# Patient Record
Sex: Male | Born: 1965 | Race: Black or African American | Hispanic: No | Marital: Married | Smoking: Never smoker
Health system: Southern US, Community
[De-identification: ages and names within clinical notes are randomized; demographics above are authoritative.]

## PROBLEM LIST (undated history)

## (undated) DIAGNOSIS — E119 Type 2 diabetes mellitus without complications: Secondary | ICD-10-CM

## (undated) DIAGNOSIS — I10 Essential (primary) hypertension: Secondary | ICD-10-CM

## (undated) DIAGNOSIS — E785 Hyperlipidemia, unspecified: Secondary | ICD-10-CM

## (undated) HISTORY — PX: NO PAST SURGERIES: SHX2092

## (undated) HISTORY — DX: Hyperlipidemia, unspecified: E78.5

---

## 2013-04-22 ENCOUNTER — Encounter: Payer: Self-pay | Admitting: Medical

## 2013-04-29 ENCOUNTER — Encounter: Payer: Self-pay | Admitting: Medical

## 2013-09-13 ENCOUNTER — Encounter (HOSPITAL_COMMUNITY): Payer: Self-pay | Admitting: Emergency Medicine

## 2013-09-13 ENCOUNTER — Emergency Department (HOSPITAL_COMMUNITY)
Admission: EM | Admit: 2013-09-13 | Discharge: 2013-09-13 | Disposition: A | Discharge status: Home / Self Care | Payer: BC Managed Care – PPO | Attending: Emergency Medicine | Admitting: Emergency Medicine

## 2013-09-13 DIAGNOSIS — J309 Allergic rhinitis, unspecified: Secondary | ICD-10-CM | POA: Insufficient documentation

## 2013-09-13 DIAGNOSIS — M549 Dorsalgia, unspecified: Secondary | ICD-10-CM | POA: Insufficient documentation

## 2013-09-13 DIAGNOSIS — R519 Headache, unspecified: Secondary | ICD-10-CM

## 2013-09-13 DIAGNOSIS — R1110 Vomiting, unspecified: Secondary | ICD-10-CM | POA: Insufficient documentation

## 2013-09-13 DIAGNOSIS — H938X9 Other specified disorders of ear, unspecified ear: Secondary | ICD-10-CM | POA: Insufficient documentation

## 2013-09-13 DIAGNOSIS — I10 Essential (primary) hypertension: Secondary | ICD-10-CM | POA: Insufficient documentation

## 2013-09-13 DIAGNOSIS — R0981 Nasal congestion: Secondary | ICD-10-CM

## 2013-09-13 DIAGNOSIS — E119 Type 2 diabetes mellitus without complications: Secondary | ICD-10-CM | POA: Insufficient documentation

## 2013-09-13 DIAGNOSIS — Z9109 Other allergy status, other than to drugs and biological substances: Secondary | ICD-10-CM

## 2013-09-13 DIAGNOSIS — G8929 Other chronic pain: Secondary | ICD-10-CM | POA: Insufficient documentation

## 2013-09-13 DIAGNOSIS — M25569 Pain in unspecified knee: Secondary | ICD-10-CM | POA: Insufficient documentation

## 2013-09-13 DIAGNOSIS — R51 Headache: Secondary | ICD-10-CM | POA: Insufficient documentation

## 2013-09-13 HISTORY — DX: Essential (primary) hypertension: I10

## 2013-09-13 HISTORY — DX: Type 2 diabetes mellitus without complications: E11.9

## 2013-09-13 LAB — CBG MONITORING, ED: Glucose-Capillary: 170 mg/dL — ABNORMAL HIGH (ref 70–99)

## 2013-09-13 MED ORDER — AZITHROMYCIN 250 MG PO TABS: ORAL_TABLET | ORAL | Status: DC

## 2013-09-13 MED ORDER — METFORMIN HCL 500 MG PO TABS: 500.0000 mg | ORAL_TABLET | Freq: Two times a day (BID) | ORAL | Status: DC

## 2013-09-13 NOTE — Discharge Instructions (Signed)
You are having a headache. No specific cause was found today for your headache. It may have been a migraine or other cause of headache. Stress, anxiety, fatigue, and depression are common triggers for headaches. Your headache today does not appear to be life-threatening or require hospitalization, but often the exact cause of headaches is not determined in the emergency department. Therefore, follow-up with your doctor is very important to find out what may have caused your headache, and whether or not you need any further diagnostic testing or treatment. Sometimes headaches can appear benign (not harmful), but then more serious symptoms can develop which should prompt an immediate re-evaluation by your doctor or the emergency department. SEEK MEDICAL ATTENTION IF: You develop possible problems with medications prescribed.  The medications don't resolve your headache, if it recurs , or if you have multiple episodes of vomiting or can't take fluids. You have a change from the usual headache. RETURN IMMEDIATELY IF you develop a sudden, severe headache or confusion, become poorly responsive or faint, develop a fever above 100.30F or problem breathing, have a change in speech, vision, swallowing, or understanding, or develop new weakness, numbness, tingling, incoordination, or have a seizure.   Sinus Headache A sinus headache is when your sinuses become clogged or swollen. Sinus headaches can range from mild to severe.  CAUSES A sinus headache can have different causes, such as:  Colds.  Sinus infections.  Allergies. SYMPTOMS  Symptoms of a sinus headache may vary and can include:  Headache.  Pain or pressure in the face.  Congested or runny nose.  Fever.  Inability to smell.  Pain in upper teeth. Weather changes can make symptoms worse. TREATMENT  The treatment of a sinus headache depends on the cause.  Sinus pain caused by a sinus infection may be treated with antibiotic  medicine.  Sinus pain caused by allergies may be helped by allergy medicines (antihistamines) and medicated nasal sprays.  Sinus pain caused by congestion may be helped by flushing the nose and sinuses with saline solution. HOME CARE INSTRUCTIONS   If antibiotics are prescribed, take them as directed. Finish them even if you start to feel better.  Only take over-the-counter or prescription medicines for pain, discomfort, or fever as directed by your caregiver.  If you have congestion, use a nasal spray to help reduce pressure. SEEK IMMEDIATE MEDICAL CARE IF:  You have a fever.  You have headaches more than once a week.  You have sensitivity to light or sound.  You have repeated nausea and vomiting.  You have vision problems.  You have sudden, severe pain in your face or head.  You have a seizure.  You are confused.  Your sinus headaches do not get better after treatment. Many people think they have a sinus headache when they actually have migraines or tension headaches. MAKE SURE YOU:   Understand these instructions.  Will watch your condition.  Will get help right away if you are not doing well or get worse. Document Released: 07/12/2004 Document Revised: 08/27/2011 Document Reviewed: 09/02/2010 [facility] Patient Information 2014 [place], [place].

## 2013-09-13 NOTE — ED Provider Notes (Signed)
CSN: [id number]     Arrival date & time 09/13/13  1623 History   First MD Initiated Contact with Patient 09/13/13 1731    This chart was scribed for non-physician practitioner, Arthor CaptainAbigail Abel Hageman, working with Glynn OctaveStephen Rancour, MD by Marica OtterNusrat Rahman, ED Scribe. This patient was seen in room WTR8/WTR8 and the patient's care was started at 5:41 PM.  PCP: No primary provider on file.  Chief Complaint  Patient presents with  . Headache  . Nasal Congestion   The history is provided by the patient. No language interpreter was used.   HPI Comments: Kenneth Hancock is a 48 y.o. male, with a history of DM and HTN, who presents to the Emergency Department complaining of a HA onset earlier today. Pt describes the HA as worsening over time. Pt denies fever. Pt also complains of associatedpost tussive vomiting at which point his HA subsided. Pt also complains of feeling pressure in his ears. Pt denies nausea, chest pain or changes in vision. Pt denies any current fever or HA. Pt also reports that he has chronic back pain and knee pain. Denies photophobia, phonophobia, UL throbbing,  visual changes, stiff neck, neck pain, rash, or "thunderclap" onset.      Past Medical History  Diagnosis Date  . Diabetes mellitus without complication   . Hypertension    History reviewed. No pertinent past surgical history. History reviewed. No pertinent family history. History  Substance Use Topics  . Smoking status: Never Smoker   . Smokeless tobacco: Not on file  . Alcohol Use: No    Review of Systems  HENT:       Pressure in ears  Gastrointestinal: Positive for vomiting. Negative for nausea.  Neurological: Positive for headaches.   Allergies  Review of patient's allergies indicates no known allergies.  Home Medications  No current outpatient prescriptions on file. BP 121/80  Pulse 110  Temp(Src) 99.9 F (37.7 C) (Oral)  Resp 16  SpO2 100% Physical Exam Physical Exam  Nursing note and vitals  reviewed. Constitutional: He appears well-developed and well-nourished. No distress.  HENT:  Head: Normocephalic and atraumatic.  Eyes: Conjunctivae normal are normal. No scleral icterus.  Neck: Normal range of motion. Neck supple.  Cardiovascular: Normal rate, regular rhythm and normal heart sounds.   Pulmonary/Chest: Effort normal and breath sounds normal. No respiratory distress.  Abdominal: Soft. There is no tenderness.  Musculoskeletal: He exhibits no edema.  Neurological: He is alert.  Speech is clear and goal oriented, follows commands Major Cranial nerves without deficit, no facial droop Normal strength in upper and lower extremities bilaterally including dorsiflexion and plantar flexion, strong and equal grip strength Sensation normal to light and sharp touch Moves extremities without ataxia, coordination intact Normal finger to nose and rapid alternating movements Neg romberg, no pronator drift Normal gait Normal heel-shin and balance Skin: Skin is warm and dry. He is not diaphoretic.  Psychiatric: His behavior is normal.    ED Course  Procedures (including critical care time) DIAGNOSTIC STUDIES: Oxygen Saturation is 100% on RA, normal by my interpretation.    COORDINATION OF CARE:  5:48 PM-Discussed treatment plan which includes checking blood sugar levels, labs, and administering fluids with pt at bedside and pt agreed to plan.   Labs Review Labs Reviewed - No data to display Imaging Review No results found.   EKG Interpretation None      MDM   Final diagnoses:  Congestion of nasal sinus  Environmental allergies  Headache  Diabetes  Pt HA treated and improved while in ED.  HA is not concerning for [facility], I[facility], Meningitis, or temporal arteritis. Pt is afebrile with no focal neuro deficits, nuchal rigidity, or change in vision. Pt is reffered to  PCP to manage his DM. Suggest otc zyrtec for allergies. Pt verbalizes understanding and is agreeable with plan  to dc.    I personally performed the services described in this documentation, which was scribed in my presence. The recorded information has been reviewed and is accurate.   Arthor Captain, PA-C 09/14/13 [phone]

## 2013-09-13 NOTE — ED Notes (Signed)
cbg 170 pt given diet coke

## 2013-09-13 NOTE — ED Notes (Signed)
Patient reports that he has pain to his Head and had  Had a recent virus. The patient reports that his headache was so bad this am that he threw up but has not sicne. He was recently dx with a virus and was sent back to work - patient feels it may have been too soon.

## 2013-09-14 NOTE — ED Provider Notes (Signed)
Medical screening examination/treatment/procedure(s) were performed by non-physician practitioner and as supervising physician I was immediately available for consultation/collaboration.   EKG Interpretation None       Saliyah Gillin, MD 09/14/13 1124 

## 2014-01-08 ENCOUNTER — Ambulatory Visit: Payer: BC Managed Care – PPO | Admitting: Family Medicine

## 2014-02-01 ENCOUNTER — Ambulatory Visit: Payer: BC Managed Care – PPO | Admitting: Family Medicine

## 2014-02-08 ENCOUNTER — Encounter: Payer: Self-pay | Admitting: Family Medicine

## 2014-02-08 ENCOUNTER — Ambulatory Visit (INDEPENDENT_AMBULATORY_CARE_PROVIDER_SITE_OTHER): Payer: BC Managed Care – PPO | Admitting: Family Medicine

## 2014-02-08 VITALS — BP 126/84 | HR 85 | Temp 97.5°F | Ht 70.0 in | Wt 169.0 lb

## 2014-02-08 DIAGNOSIS — E785 Hyperlipidemia, unspecified: Secondary | ICD-10-CM

## 2014-02-08 DIAGNOSIS — I10 Essential (primary) hypertension: Secondary | ICD-10-CM | POA: Insufficient documentation

## 2014-02-08 DIAGNOSIS — E1159 Type 2 diabetes mellitus with other circulatory complications: Secondary | ICD-10-CM

## 2014-02-08 DIAGNOSIS — Z23 Encounter for immunization: Secondary | ICD-10-CM

## 2014-02-08 LAB — CBC WITH DIFFERENTIAL/PLATELET
BASOS PCT: 0.4 % (ref 0.0–3.0)
Basophils Absolute: 0 10*3/uL (ref 0.0–0.1)
EOS PCT: 1.3 % (ref 0.0–5.0)
Eosinophils Absolute: 0.1 10*3/uL (ref 0.0–0.7)
HCT: 49.2 % (ref 39.0–52.0)
Hemoglobin: 16.4 g/dL (ref 13.0–17.0)
LYMPHS PCT: 30.7 % (ref 12.0–46.0)
Lymphs Abs: 1.6 10*3/uL (ref 0.7–4.0)
MCHC: 33.2 g/dL (ref 30.0–36.0)
MCV: 88.1 fl (ref 78.0–100.0)
MONOS PCT: 8.2 % (ref 3.0–12.0)
Monocytes Absolute: 0.4 10*3/uL (ref 0.1–1.0)
NEUTROS PCT: 59.4 % (ref 43.0–77.0)
Neutro Abs: 3.2 10*3/uL (ref 1.4–7.7)
Platelets: 261 10*3/uL (ref 150.0–400.0)
RBC: 5.59 Mil/uL (ref 4.22–5.81)
RDW: 14.2 % (ref 11.5–15.5)
WBC: 5.4 10*3/uL (ref 4.0–10.5)

## 2014-02-08 LAB — HEPATIC FUNCTION PANEL
ALK PHOS: 55 U/L (ref 39–117)
ALT: 24 U/L (ref 0–53)
AST: 24 U/L (ref 0–37)
Albumin: 4.1 g/dL (ref 3.5–5.2)
BILIRUBIN DIRECT: 0.2 mg/dL (ref 0.0–0.3)
BILIRUBIN TOTAL: 1 mg/dL (ref 0.2–1.2)
Total Protein: 7.3 g/dL (ref 6.0–8.3)

## 2014-02-08 LAB — POCT URINALYSIS DIPSTICK
Bilirubin, UA: NEGATIVE
Blood, UA: NEGATIVE
GLUCOSE UA: NEGATIVE
Ketones, UA: NEGATIVE
Leukocytes, UA: NEGATIVE
Nitrite, UA: NEGATIVE
Protein, UA: NEGATIVE
Spec Grav, UA: 1.025
UROBILINOGEN UA: 0.2
pH, UA: 6

## 2014-02-08 LAB — MICROALBUMIN / CREATININE URINE RATIO
CREATININE, U: 175.7 mg/dL
Microalb Creat Ratio: 0.6 mg/g (ref 0.0–30.0)
Microalb, Ur: 1 mg/dL (ref 0.0–1.9)

## 2014-02-08 LAB — PSA: PSA: 1.12 ng/mL (ref 0.10–4.00)

## 2014-02-08 LAB — TSH: TSH: 2.1 u[IU]/mL (ref 0.35–4.50)

## 2014-02-08 LAB — LIPID PANEL
CHOL/HDL RATIO: 5
Cholesterol: 184 mg/dL (ref 0–200)
HDL: 35.1 mg/dL — ABNORMAL LOW (ref >39.00)
LDL Cholesterol: 142 mg/dL — ABNORMAL HIGH (ref 0–99)
NonHDL: 148.9
TRIGLYCERIDES: 37 mg/dL (ref 0.0–149.0)
VLDL: 7.4 mg/dL (ref 0.0–40.0)

## 2014-02-08 LAB — BASIC METABOLIC PANEL
BUN: 15 mg/dL (ref 6–23)
CALCIUM: 9.4 mg/dL (ref 8.4–10.5)
CO2: 27 mEq/L (ref 19–32)
Chloride: 102 mEq/L (ref 96–112)
Creatinine, Ser: 1.1 mg/dL (ref 0.4–1.5)
GFR: 96.59 mL/min (ref >60.00)
Glucose, Bld: 154 mg/dL — ABNORMAL HIGH (ref 70–99)
Potassium: 4.4 mEq/L (ref 3.5–5.1)
SODIUM: 140 meq/L (ref 135–145)

## 2014-02-08 LAB — HEMOGLOBIN A1C: Hgb A1c MFr Bld: 11.2 % — ABNORMAL HIGH (ref 4.6–6.5)

## 2014-02-08 MED ORDER — METFORMIN HCL 500 MG PO TABS
ORAL_TABLET | ORAL | Status: DC
Start: 2014-02-08 — End: 2014-02-09

## 2014-02-08 NOTE — Patient Instructions (Signed)
Diabetes and Standards of Medical Care Diabetes is complicated. You may find that your diabetes team includes a dietitian, nurse, diabetes educator, eye doctor, and more. To help everyone know what is going on and to help you get the care you deserve, the following schedule of care was developed to help keep you on track. Below are the tests, exams, vaccines, medicines, education, and plans you will need. HbA1c test This test shows how well you have controlled your glucose over the past 2-3 months. It is used to see if your diabetes management plan needs to be adjusted.   It is performed at least 2 times a year if you are meeting treatment goals.  It is performed 4 times a year if therapy has changed or if you are not meeting treatment goals. Blood pressure test  This test is performed at every routine medical visit. The goal is less than 140/90 mm Hg for most people, but 130/80 mm Hg in some cases. Ask your health care provider about your goal. Dental exam  Follow up with the dentist regularly. Eye exam  If you are diagnosed with type 1 diabetes as a child, get an exam upon reaching the age of 37 years or older and have had diabetes for 3-5 years. Yearly eye exams are recommended after that initial eye exam.  If you are diagnosed with type 1 diabetes as an adult, get an exam within 5 years of diagnosis and then yearly.  If you are diagnosed with type 2 diabetes, get an exam as soon as possible after the diagnosis and then yearly. Foot care exam  Visual foot exams are performed at every routine medical visit. The exams check for cuts, injuries, or other problems with the feet.  A comprehensive foot exam should be done yearly. This includes visual inspection as well as assessing foot pulses and testing for loss of sensation.  Check your feet nightly for cuts, injuries, or other problems with your feet. Tell your health care provider if anything is not healing. Kidney function test (urine  microalbumin)  This test is performed once a year.  Type 1 diabetes: The first test is performed 5 years after diagnosis.  Type 2 diabetes: The first test is performed at the time of diagnosis.  A serum creatinine and estimated glomerular filtration rate (eGFR) test is done once a year to assess the level of chronic kidney disease (CKD), if present. Lipid profile (cholesterol, HDL, LDL, triglycerides)  Performed every 5 years for most people.  The goal for LDL is less than 100 mg/dL. If you are at high risk, the goal is less than 70 mg/dL.  The goal for HDL is 40 mg/dL-50 mg/dL for men and 50 mg/dL-60 mg/dL for women. An HDL cholesterol of 60 mg/dL or higher gives some protection against heart disease.  The goal for triglycerides is less than 150 mg/dL. Influenza vaccine, pneumococcal vaccine, and hepatitis B vaccine  The influenza vaccine is recommended yearly.  It is recommended that people with diabetes who are over 24 years old get the pneumonia vaccine. In some cases, two separate shots may be given. Ask your health care provider if your pneumonia vaccination is up to date.  The hepatitis B vaccine is also recommended for adults with diabetes. Diabetes self-management education  Education is recommended at diagnosis and ongoing as needed. Treatment plan  Your treatment plan is reviewed at every medical visit. Document Released: 04/01/2009 Document Revised: 10/19/2013 Document Reviewed: 11/04/2012 [facility] Patient Information 2015 [place],  LLC. This information is not intended to replace advice given to you by your health care provider. Make sure you discuss any questions you have with your health care provider.  

## 2014-02-08 NOTE — Progress Notes (Signed)
Subjective:    Patient ID: Kenneth Hancock, male    DOB: July 05, 1965, 48 y.o.   MRN: [id number]  HPI  HPI HYPERTENSION  Blood pressure range-126/87  Chest pain- no      Dyspnea- no Lightheadedness- no   Edema- no Other side effects - no   Medication compliance: good Low salt diet- no  DIABETES  Blood Sugar ranges-82-260  Polyuria- no New Visual problems- no Hypoglycemic symptoms- yes Other side effects-no Medication compliance - good Last eye exam- overdue Foot exam- today  HYPERLIPIDEMIA  Medication compliance- goo RUQ pain- no  Muscle aches- no Other side effects-no       Review of Systems As above Past Medical History  Diagnosis Date  . Diabetes mellitus without complication   . Hypertension   . Hyperlipidemia    History   Social History  . Marital Status: Married    Spouse Name: N/A    Number of Children: N/A  . Years of Education: N/A   Occupational History  .  [email or web address]    custodian   Social History Main Topics  . Smoking status: Never Smoker   . Smokeless tobacco: Not on file  . Alcohol Use: No  . Drug Use: No  . Sexual Activity: Yes    Partners: Female   Other Topics Concern  . Not on file   Social History Narrative   Exercise-- walk   Family History  Problem Relation Age of Onset  . Breast cancer Mother   . Hypertension Mother   . Diabetes Mother   . Cancer Mother 87    breast  . Ovarian cancer Sister   . Cancer Sister 20    ovarian  . Hypertension Sister   . Hyperlipidemia Sister   . Hypertension Father   . Hypertension Brother   . Diabetes Paternal Grandmother   . Diabetes Maternal Aunt    Current Outpatient Prescriptions  Medication Sig Dispense Refill  . metFORMIN (GLUCOPHAGE) 500 MG tablet 1 po qd    3   No current facility-administered medications for this visit.       Objective:   Physical Exam BP 126/84  Pulse 85  Temp(Src) 97.5 F (36.4 C) (Oral)  Ht  (1.778 m)  Wt 169 lb (76.658  kg)  BMI 24.25 kg/m2  SpO2 99% General appearance: alert, cooperative, appears stated age and no distress Lungs: clear to auscultation bilaterally Heart: S1, S2 normal Extremities: extremities normal, atraumatic, no cyanosis or edema Sensory exam of the foot is normal, tested with the monofilament. Good pulses, no lesions or ulcers, good peripheral pulses.        Assessment & Plan:  1. Type II or unspecified type diabetes mellitus with peripheral circulatory disorders, uncontrolled(250.72) Pt only taking 1 metformin-- check labs - Basic metabolic panel - CBC with Differential - Hemoglobin A1c - Hepatic function panel - Lipid panel - Microalbumin / creatinine urine ratio - POCT urinalysis dipstick - PSA - TSH - Ambulatory referral to Ophthalmology - metFORMIN (GLUCOPHAGE) 500 MG tablet; 1 po qd; Refill: 3  2. Essential hypertension Pt can't remember med--  He will call office with meds  3. Other and unspecified hyperlipidemia Check labs Pt does not remember name of med he is taking-- he will call with name  4. Need for diphtheria-tetanus-pertussis (Tdap) vaccine, adult/adolescent   - Tdap vaccine greater than or equal to 7yo IM  5. Need for pneumococcal vaccination  - Pneumococcal polysaccharide vaccine 23-valent greater than or  equal to 2yo subcutaneous/IM

## 2014-02-08 NOTE — Progress Notes (Signed)
Pre visit review using our clinic review tool, if applicable. No additional management support is needed unless otherwise documented below in the visit note. 

## 2014-02-09 ENCOUNTER — Telehealth: Payer: Self-pay | Admitting: Family Medicine

## 2014-02-09 ENCOUNTER — Telehealth: Payer: Self-pay

## 2014-02-09 DIAGNOSIS — E1159 Type 2 diabetes mellitus with other circulatory complications: Secondary | ICD-10-CM

## 2014-02-09 MED ORDER — METFORMIN HCL 500 MG PO TABS: ORAL_TABLET | ORAL | Status: DC

## 2014-02-09 NOTE — Telephone Encounter (Signed)
Spoke with patient and he voiced understanding, he has agreed to increase the metformin and will call tomorrow with the names of the other medications.    KP

## 2014-02-09 NOTE — Telephone Encounter (Signed)
I tried calling the patient but the line tells me that the caller I am trying to reach is not reachable.     KP

## 2014-02-09 NOTE — Telephone Encounter (Signed)
Relevant patient education assigned to patient using Emmi. ° °

## 2014-02-09 NOTE — Telephone Encounter (Signed)
Tavarus returned call but was not sure who called him

## 2014-02-09 NOTE — Telephone Encounter (Signed)
Dm not controlled--- His sugars are running very high. He needs to increase the metformin to bid or we need to add medication--- change to janumet xr 100/1000 1 po qd #30 With coupon, 2 refills We need to know the statin and bp med he is on as well. 272.4 250.01 Lipid, hep, bmp, hgba1c

## 2014-04-22 ENCOUNTER — Ambulatory Visit: Payer: BC Managed Care – PPO | Admitting: Family Medicine

## 2014-05-17 ENCOUNTER — Telehealth: Payer: Self-pay | Admitting: *Deleted

## 2014-05-17 ENCOUNTER — Ambulatory Visit: Payer: BC Managed Care – PPO | Admitting: Family Medicine

## 2014-05-17 NOTE — Telephone Encounter (Signed)
Pt did not show for appointment 05/17/2014 at 2:45pm for "45mo f/u diabetes"

## 2014-05-17 NOTE — Telephone Encounter (Signed)
Pt needs f/u-- please call

## 2014-08-09 ENCOUNTER — Telehealth: Payer: Self-pay | Admitting: Family Medicine

## 2014-08-09 DIAGNOSIS — E119 Type 2 diabetes mellitus without complications: Secondary | ICD-10-CM

## 2014-08-09 MED ORDER — METFORMIN HCL 500 MG PO TABS: ORAL_TABLET | ORAL | Status: DC

## 2014-08-09 NOTE — Telephone Encounter (Signed)
Caller name: Ruthine DoseJoe, [place] Relation to pt: self  Call back number: [phone] Pharmacy:  [id number] - [place], [place] - [id number] [facility] [phone] (Phone) [phone] (Fax)         Reason for call:  Pt requesting a refill metFORMIN (GLUCOPHAGE) 500 MG tablet. Pt scheduled med check for  10/11/14 requesting RX to hold him over until appointment.

## 2014-08-09 NOTE — Telephone Encounter (Signed)
Rx faxed. Unable to leave a message on the patient's cell.      KP

## 2014-09-30 LAB — HM DIABETES EYE EXAM

## 2014-10-05 ENCOUNTER — Encounter: Payer: Self-pay | Admitting: Family Medicine

## 2014-10-05 ENCOUNTER — Ambulatory Visit (INDEPENDENT_AMBULATORY_CARE_PROVIDER_SITE_OTHER): Payer: BC Managed Care – PPO | Admitting: Family Medicine

## 2014-10-05 VITALS — BP 132/84 | HR 84 | Temp 98.1°F | Wt 166.6 lb

## 2014-10-05 DIAGNOSIS — I10 Essential (primary) hypertension: Secondary | ICD-10-CM

## 2014-10-05 DIAGNOSIS — E1165 Type 2 diabetes mellitus with hyperglycemia: Secondary | ICD-10-CM

## 2014-10-05 DIAGNOSIS — E785 Hyperlipidemia, unspecified: Secondary | ICD-10-CM | POA: Diagnosis not present

## 2014-10-05 DIAGNOSIS — IMO0002 Reserved for concepts with insufficient information to code with codable children: Secondary | ICD-10-CM

## 2014-10-05 MED ORDER — METFORMIN HCL ER 500 MG PO TB24: 500.0000 mg | ORAL_TABLET | Freq: Every day | ORAL | Status: DC

## 2014-10-05 NOTE — Progress Notes (Signed)
Patient ID: Kenneth Hancock, male    DOB: Jul 19, 1965  Age: 49 y.o. MRN: [id number]    Subjective:  Subjective HPI Kenneth Hancock presents for f/u dm, htn, choesterol.  HPI HYPERTENSION  Blood pressure range-not checking  Chest pain- no      Dyspnea- no Lightheadedness- no   Edema- no Other side effects - no   Medication compliance: good Low salt diet- yes  DIABETES  Blood Sugar ranges-not checking  Polyuria- no New Visual problems- no Hypoglycemic symptoms- no Other side effects-no Medication compliance - good Last eye exam- last few months Foot exam- today  HYPERLIPIDEMIA  Medication compliance- good RUQ pain- no  Muscle aches- no Other side effects-no     Review of Systems  Constitutional: Positive for fatigue. Negative for unexpected weight change.  Respiratory: Negative for cough and shortness of breath.   Cardiovascular: Negative for chest pain and palpitations.    History Past Medical History  Diagnosis Date  . Diabetes mellitus without complication   . Hypertension   . Hyperlipidemia     He has past surgical history that includes No past surgeries.   His family history includes Breast cancer in his mother; Cancer (age of onset: 25) in his sister; Cancer (age of onset: 56) in his mother; Diabetes in his maternal aunt, mother, and paternal grandmother; Hyperlipidemia in his sister; Hypertension in his brother, father, mother, and sister; Ovarian cancer in his sister.He reports that he has never smoked. He does not have any smokeless tobacco history on file. He reports that he does not drink alcohol or use illicit drugs.  No current outpatient prescriptions on file prior to visit.   No current facility-administered medications on file prior to visit.     Objective:  Objective Physical Exam  Constitutional: He appears well-developed and well-nourished. No distress.  Cardiovascular: Normal rate, regular rhythm and normal heart sounds.   Pulmonary/Chest: Effort  normal and breath sounds normal. No respiratory distress.  Psychiatric: He has a normal mood and affect. His behavior is normal. Judgment and thought content normal.  Sensory exam of the foot is normal, tested with the monofilament. Good pulses, no lesions or ulcers, good peripheral pulses.  BP 132/84 mmHg  Pulse 84  Temp(Src) 98.1 F (36.7 C) (Oral)  Wt 166 lb 9.6 oz (75.569 kg)  SpO2 97% Wt Readings from Last 3 Encounters:  10/05/14 166 lb 9.6 oz (75.569 kg)  02/08/14 169 lb (76.658 kg)     Lab Results  Component Value Date   WBC 5.4 02/08/2014   HGB 16.4 02/08/2014   HCT 49.2 02/08/2014   PLT 261.0 02/08/2014   GLUCOSE 154* 02/08/2014   CHOL 184 02/08/2014   TRIG 37.0 02/08/2014   HDL 35.10* 02/08/2014   LDLCALC 142* 02/08/2014   ALT 24 02/08/2014   AST 24 02/08/2014   NA 140 02/08/2014   K 4.4 02/08/2014   CL 102 02/08/2014   CREATININE 1.1 02/08/2014   BUN 15 02/08/2014   CO2 27 02/08/2014   TSH 2.10 02/08/2014   PSA 1.12 02/08/2014   HGBA1C 11.2* 02/08/2014   MICROALBUR 1.0 02/08/2014    No results found.   Assessment & Plan:  Plan I have discontinued Mr. Trumbull metFORMIN. I am also having him start on metFORMIN. Additionally, I am having him maintain his ACCU-CHEK AVIVA PLUS.  Meds ordered this encounter  Medications  . ACCU-CHEK AVIVA PLUS test strip    Sig:   . metFORMIN (GLUCOPHAGE XR) 500 MG 24 hr tablet  Sig: Take 1 tablet (500 mg total) by mouth daily with breakfast.    Dispense:  30 tablet    Refill:  2    Problem List Items Addressed This Visit    HTN (hypertension)   Relevant Orders   Basic metabolic panel   CBC with Differential/Platelet   Hemoglobin A1c   Hepatic function panel   Lipid panel   Microalbumin / creatinine urine ratio   POCT urinalysis dipstick    Other Visit Diagnoses    Diabetes mellitus type II, uncontrolled    -  Primary    Relevant Medications    metFORMIN (GLUCOPHAGE XR) 500 MG 24 hr tablet    Other Relevant  Orders    Ambulatory referral to Endocrinology    Ambulatory referral to diabetic education    Basic metabolic panel    CBC with Differential/Platelet    Hemoglobin A1c    Hepatic function panel    Lipid panel    Microalbumin / creatinine urine ratio    POCT urinalysis dipstick    Hyperlipidemia LDL goal <70        Relevant Orders    Basic metabolic panel    CBC with Differential/Platelet    Hemoglobin A1c    Hepatic function panel    Lipid panel    Microalbumin / creatinine urine ratio    POCT urinalysis dipstick       Follow-up: Return in about 3 months (around 01/04/2015), or if symptoms worsen or fail to improve, for labs at [facility].  Loreen FreudYvonne Lowne, DO

## 2014-10-05 NOTE — Progress Notes (Signed)
Pre visit review using our clinic review tool, if applicable. No additional management support is needed unless otherwise documented below in the visit note. 

## 2014-10-05 NOTE — Patient Instructions (Signed)

## 2014-10-11 ENCOUNTER — Ambulatory Visit: Payer: BC Managed Care – PPO | Admitting: Family Medicine

## 2014-10-26 ENCOUNTER — Encounter: Payer: Self-pay | Admitting: Family Medicine

## 2014-11-18 ENCOUNTER — Encounter: Payer: BC Managed Care – PPO | Admitting: Dietician

## 2015-01-04 ENCOUNTER — Ambulatory Visit: Payer: BC Managed Care – PPO | Admitting: Family Medicine

## 2015-01-05 ENCOUNTER — Telehealth: Payer: Self-pay | Admitting: Family Medicine

## 2015-01-05 ENCOUNTER — Encounter: Payer: Self-pay | Admitting: Family Medicine

## 2015-01-05 NOTE — Telephone Encounter (Signed)
charge 

## 2015-01-05 NOTE — Telephone Encounter (Signed)
Patient no show 01/04/15 letter mailed - charge or no charge  °

## 2015-03-25 ENCOUNTER — Encounter (HOSPITAL_COMMUNITY): Payer: Self-pay | Admitting: Emergency Medicine

## 2015-03-25 ENCOUNTER — Emergency Department (HOSPITAL_COMMUNITY)
Admission: EM | Admit: 2015-03-25 | Discharge: 2015-03-25 | Disposition: A | Discharge status: Home / Self Care | Payer: BC Managed Care – PPO | Attending: Emergency Medicine | Admitting: Emergency Medicine

## 2015-03-25 DIAGNOSIS — R112 Nausea with vomiting, unspecified: Secondary | ICD-10-CM | POA: Diagnosis not present

## 2015-03-25 DIAGNOSIS — R197 Diarrhea, unspecified: Secondary | ICD-10-CM | POA: Insufficient documentation

## 2015-03-25 DIAGNOSIS — R55 Syncope and collapse: Secondary | ICD-10-CM | POA: Diagnosis not present

## 2015-03-25 DIAGNOSIS — E119 Type 2 diabetes mellitus without complications: Secondary | ICD-10-CM | POA: Diagnosis not present

## 2015-03-25 DIAGNOSIS — I10 Essential (primary) hypertension: Secondary | ICD-10-CM | POA: Insufficient documentation

## 2015-03-25 DIAGNOSIS — R531 Weakness: Secondary | ICD-10-CM | POA: Diagnosis not present

## 2015-03-25 DIAGNOSIS — Z79899 Other long term (current) drug therapy: Secondary | ICD-10-CM | POA: Insufficient documentation

## 2015-03-25 DIAGNOSIS — R51 Headache: Secondary | ICD-10-CM | POA: Insufficient documentation

## 2015-03-25 LAB — COMPREHENSIVE METABOLIC PANEL
ALBUMIN: 4.6 g/dL (ref 3.5–5.0)
ALK PHOS: 53 U/L (ref 38–126)
ALT: 25 U/L (ref 17–63)
ANION GAP: 10 (ref 5–15)
AST: 23 U/L (ref 15–41)
BUN: 18 mg/dL (ref 6–20)
CO2: 28 mmol/L (ref 22–32)
CREATININE: 1.07 mg/dL (ref 0.61–1.24)
Calcium: 9.4 mg/dL (ref 8.9–10.3)
Chloride: 99 mmol/L — ABNORMAL LOW (ref 101–111)
GFR calc Af Amer: >60 mL/min (ref >60)
GFR calc non Af Amer: >60 mL/min (ref >60)
Glucose, Bld: 216 mg/dL — ABNORMAL HIGH (ref 65–99)
Potassium: 4.3 mmol/L (ref 3.5–5.1)
Sodium: 137 mmol/L (ref 135–145)
Total Bilirubin: 1.3 mg/dL — ABNORMAL HIGH (ref 0.3–1.2)
Total Protein: 8.3 g/dL — ABNORMAL HIGH (ref 6.5–8.1)

## 2015-03-25 LAB — URINALYSIS, ROUTINE W REFLEX MICROSCOPIC
Bilirubin Urine: NEGATIVE
HGB URINE DIPSTICK: NEGATIVE
KETONES UR: 15 mg/dL — AB
LEUKOCYTES UA: NEGATIVE
Nitrite: NEGATIVE
PH: 6 (ref 5.0–8.0)
Protein, ur: NEGATIVE mg/dL
Specific Gravity, Urine: 1.039 — ABNORMAL HIGH (ref 1.005–1.030)
Urobilinogen, UA: 0.2 mg/dL (ref 0.0–1.0)

## 2015-03-25 LAB — CBC
HCT: 48.5 % (ref 39.0–52.0)
HEMOGLOBIN: 16.6 g/dL (ref 13.0–17.0)
MCH: 29.9 pg (ref 26.0–34.0)
MCHC: 34.2 g/dL (ref 30.0–36.0)
MCV: 87.4 fL (ref 78.0–100.0)
Platelets: 290 10*3/uL (ref 150–400)
RBC: 5.55 MIL/uL (ref 4.22–5.81)
RDW: 13.6 % (ref 11.5–15.5)
WBC: 7.2 10*3/uL (ref 4.0–10.5)

## 2015-03-25 LAB — URINE MICROSCOPIC-ADD ON

## 2015-03-25 LAB — I-STAT TROPONIN, ED: Troponin i, poc: 0 ng/mL (ref 0.00–0.08)

## 2015-03-25 LAB — POC OCCULT BLOOD, ED: FECAL OCCULT BLD: NEGATIVE

## 2015-03-25 LAB — LIPASE, BLOOD: Lipase: 51 U/L (ref 22–51)

## 2015-03-25 MED ORDER — MECLIZINE HCL 25 MG PO TABS: 25.0000 mg | ORAL_TABLET | Freq: Three times a day (TID) | ORAL | Status: AC | PRN

## 2015-03-25 MED ORDER — OXYMETAZOLINE HCL 0.05 % NA SOLN
1.0000 | Freq: Once | NASAL | Status: AC
  Administered 2015-03-25: 1 via NASAL
  Filled 2015-03-25: qty 15

## 2015-03-25 MED ORDER — MECLIZINE HCL 25 MG PO TABS
25.0000 mg | ORAL_TABLET | Freq: Once | ORAL | Status: AC
  Administered 2015-03-25: 25 mg via ORAL
  Filled 2015-03-25: qty 1

## 2015-03-25 MED ORDER — ONDANSETRON 4 MG PO TBDP: ORAL_TABLET | ORAL | Status: AC

## 2015-03-25 MED ORDER — SODIUM CHLORIDE 0.9 % IV BOLUS (SEPSIS)
1000.0000 mL | Freq: Once | INTRAVENOUS | Status: AC
  Administered 2015-03-25: 1000 mL via INTRAVENOUS

## 2015-03-25 NOTE — ED Notes (Signed)
Pt states, "Feel better, not dizzy anymore".

## 2015-03-25 NOTE — ED Notes (Signed)
Pt was given ginger ale and graham crackers and peanut butter for po challenge.

## 2015-03-25 NOTE — ED Notes (Signed)
MD at bedside. 

## 2015-03-25 NOTE — ED Notes (Signed)
Patient transported to X-ray 

## 2015-03-25 NOTE — Progress Notes (Addendum)
Pt stated he felt much better. He has a slight headache which he feels was from vomitting earlier. Pt received one liter of fluid-9p- Pt c/o feeling very dizzy and woozy when he stood up. PA aware and ordered antivert for the pt. Pts nurse made aware.

## 2015-03-25 NOTE — ED Notes (Addendum)
Pt from home. Pt reports n/v that began on Wednesday. Began to have weakness yesterday. Denies any emesis today. No pain. Pt told EMS he had 2 near-syncopal episodes today. Pt report he had diarrhea yesterday, but none today.

## 2015-03-25 NOTE — ED Provider Notes (Signed)
CSN: [id number]     Arrival date & time 03/25/15  1211 History   First MD Initiated Contact with Patient 03/25/15 1459     Chief Complaint  Patient presents with  . Weakness  . Emesis     (Consider location/radiation/quality/duration/timing/severity/associated sxs/prior Treatment) The history is provided by the patient and medical records. No language interpreter was used.     Kenneth Hancock is a 49 y.o. male  with a hx of NIDDM, HTN, hyperlipidemia presents to the Emergency Department complaining of gradual, persistent, progressively worsening generalzied weakness onset yesterday.  Pt reports nausea and vomiting that began 2 days ago with resolution of both the vomiting and diarrhea but persistence of the weakness. Pt also reports near syncope x2 to EMS.  Associated symptoms include intermittent, mild, frontal headache.  Pt reports taking 2 ibuprofen with minor relief, but the headache has since resolved entirely.  He reports the headache begans gradually.  He denies thunderclap headache or worst headache of his life.  Pt reports the headache does worsen when he stands and is accompanied by a dizziness (room spinning) and lightheadedness. Pt reports associated nausea yesterday, but none today.  Pt reports diarrhea was loose, but he did not look to see if there was blood.  Pt reports emesis was NBNB.  Nothing seem to make the symptoms better and Nothing makes it worse.  Pt's wife reports that this morning he fell this morning, but was never unconscious.  Pt denies fever, chills, neck pain, neck stiffness, chest pain, SOB, abd pain, dysuria, hematuria.   Today pt has been able to eat soup and water without emesis.  Wife reports his weakness persists.   Pt denies tick bites, travel or sick contacts.     Past Medical History  Diagnosis Date  . Diabetes mellitus without complication (HCC)   . Hypertension   . Hyperlipidemia    Past Surgical History  Procedure Laterality Date  . No past surgeries      Family History  Problem Relation Age of Onset  . Breast cancer Mother   . Hypertension Mother   . Diabetes Mother   . Cancer Mother 72    breast  . Ovarian cancer Sister   . Cancer Sister 21    ovarian  . Hypertension Sister   . Hyperlipidemia Sister   . Hypertension Father   . Hypertension Brother   . Diabetes Paternal Grandmother   . Diabetes Maternal Aunt    Social History  Substance Use Topics  . Smoking status: Never Smoker   . Smokeless tobacco: None  . Alcohol Use: No    Review of Systems  Constitutional: Negative for fever, diaphoresis, appetite change, fatigue and unexpected weight change.  HENT: Negative for mouth sores.   Eyes: Negative for visual disturbance.  Respiratory: Negative for cough, chest tightness, shortness of breath and wheezing.   Cardiovascular: Negative for chest pain.  Gastrointestinal: Positive for nausea, vomiting and diarrhea. Negative for abdominal pain and constipation.  Endocrine: Negative for polydipsia, polyphagia and polyuria.  Genitourinary: Negative for dysuria, urgency, frequency and hematuria.  Musculoskeletal: Negative for back pain and neck stiffness.  Skin: Negative for rash.  Allergic/Immunologic: Negative for immunocompromised state.  Neurological: Positive for dizziness, weakness, light-headedness and headaches. Negative for syncope.  Hematological: Does not bruise/bleed easily.  Psychiatric/Behavioral: Negative for sleep disturbance. The patient is not nervous/anxious.       Allergies  Review of patient's allergies indicates no known allergies.  Home Medications   Prior to  Admission medications   Medication Sig Start Date End Date Taking? Authorizing Provider  ibuprofen (ADVIL,MOTRIN) 200 MG tablet Take 400 mg by mouth every 6 (six) hours as needed for fever, headache, mild pain, moderate pain or cramping.   Yes Historical Provider, MD  metFORMIN (GLUCOPHAGE) 500 MG tablet Take 500 mg by mouth 2 (two) times daily  with a meal.   Yes Historical Provider, MD  metFORMIN (GLUCOPHAGE XR) 500 MG 24 hr tablet Take 1 tablet (500 mg total) by mouth daily with breakfast. Patient not taking: Reported on 03/25/2015 10/05/14   Lelon Perla, DO  ondansetron (ZOFRAN ODT) 4 MG disintegrating tablet 4mg  ODT q4 hours prn nausea/vomit 03/25/15   Ruble Buttler, PA-C   BP 141/82 mmHg  Pulse 66  Temp(Src) 98.6 F (37 C) (Oral)  Resp 16  Ht 5\' 9"  (1.753 m)  Wt 169 lb (76.658 kg)  BMI 24.95 kg/m2  SpO2 98% Physical Exam  Constitutional: He is oriented to person, place, and time. He appears well-developed and well-nourished. No distress.  Awake, alert, nontoxic appearance  HENT:  Head: Normocephalic and atraumatic.  Right Ear: External ear and ear canal normal. A middle ear effusion (clear) is present.  Left Ear: Tympanic membrane, external ear and ear canal normal.  Nose: Mucosal edema (mild) present. No rhinorrhea. No epistaxis. Right sinus exhibits no maxillary sinus tenderness and no frontal sinus tenderness. Left sinus exhibits no maxillary sinus tenderness and no frontal sinus tenderness.  Mouth/Throat: Uvula is midline, oropharynx is clear and moist and mucous membranes are normal. Mucous membranes are not pale and not cyanotic. No oropharyngeal exudate, posterior oropharyngeal edema, posterior oropharyngeal erythema or tonsillar abscesses.  Eyes: Conjunctivae are normal. Pupils are equal, round, and reactive to light. No scleral icterus.  Neck: Normal range of motion and full passive range of motion without pain. Neck supple.  Cardiovascular: Normal rate, regular rhythm, normal heart sounds and intact distal pulses.   No murmur heard. Pulmonary/Chest: Effort normal and breath sounds normal. No stridor. No respiratory distress. He has no wheezes.  Equal chest expansion  Abdominal: Soft. Bowel sounds are normal. He exhibits no distension and no mass. There is no hepatosplenomegaly. There is no tenderness. There  is no rebound, no guarding and no CVA tenderness.  Musculoskeletal: Normal range of motion. He exhibits no edema.  Lymphadenopathy:    He has no cervical adenopathy.  Neurological: He is alert and oriented to person, place, and time.  Speech is clear and goal oriented, follows commands Major Cranial nerves without deficit, no facial droop Normal strength in upper and lower extremities bilaterally including dorsiflexion and plantar flexion, strong and equal grip strength Sensation normal to light and sharp touch Moves extremities without ataxia, coordination intact Normal finger to nose and rapid alternating movements Neg romberg, no pronator drift Normal gait Normal heel-shin and balance No clonus  Skin: Skin is warm and dry. No rash noted. He is not diaphoretic. No erythema.  Psychiatric: He has a normal mood and affect.  Nursing note and vitals reviewed.   ED Course  Procedures (including critical care time) Labs Review Labs Reviewed  COMPREHENSIVE METABOLIC PANEL - Abnormal; Notable for the following:    Chloride 99 (*)    Glucose, Bld 216 (*)    Total Protein 8.3 (*)    Total Bilirubin 1.3 (*)    All other components within normal limits  URINALYSIS, ROUTINE W REFLEX MICROSCOPIC (NOT AT [facility]) - Abnormal; Notable for the following:  Specific Gravity, Urine 1.039 (*)    Glucose, UA >1000 (*)    Ketones, ur 15 (*)    All other components within normal limits  LIPASE, BLOOD  CBC  URINE MICROSCOPIC-ADD ON  POC OCCULT BLOOD, ED  I-STAT TROPOININ, ED    Imaging Review No results found. I have personally reviewed and evaluated these images and lab results as part of my medical decision-making.   EKG Interpretation None      MDM   Final diagnoses:  Generalized weakness  Non-intractable vomiting with nausea, vomiting of unspecified type    Kong Packett presents with generalized weakness for several episodes of vomiting and diarrhea.  No travel or sick contacts.   Patient is well-appearing in bed. Abdomen is soft and nontender. Mucous membranes are moist. No tachycardia. Afebrile.  We'll give fluid bolus and reassess.  4:02 PM Patient is without orthostasis. Fecal occult negative.  Orthostatic VS for the past 24 hrs:  BP- Lying Pulse- Lying BP- Sitting Pulse- Sitting BP- Standing at 0 minutes Pulse- Standing at 0 minutes  03/25/15 1531 156/79 mmHg 68 135/85 mmHg 72 (!) 148/98 mmHg 81    6:01 PM Pt reports feeling well with resolved weakness.  No emesis or diarrhea here in the emergency department.  Will ambulate and by mouth trial.  8:06 PM Patient ambulates here in the department with steady gait. He reported to the RN that he felt "a little woozy" but denied vertiginous symptoms to me. By mouth trial without difficulty.  Pt remains without orthostasis.  Denies near syncope with position changes.  No abdominal pain. Abdomen remained soft and nontender on repeat exam.  8:55 PM At discharge patient complains of return of his vertiginous symptoms which have resolved prior. Give meclizine. On repeat exam he remains without nystagmus or evidence of central process.  No ataxia or past pointing.  Patient does have a clear middle ear effusion. Will give meclizine and afrin.    9:45PM Patient with resolution of vertigo symptoms after meclizine. He ambulates in the hall with me with steady gait and without assistance. He reports he feels well.  Patient with no nystagmus, negative skew test and normal neurologic exam.  No vertical or rotational nystagmus. Patient normal finger-nose and normal gait.  No slurred speech renal or weakness.  Doubt CVA or other central cause of vertigo.  History and physical consistent with peripheral vertigo symptoms.  We'll discharge home with meclizine. Patient instructed to followup with her primary care physician or neurology within 3 days for further evaluation. They are to return to the emergency department for new neurologic  symptoms, loss of vision or other concerning symptoms.  BP 131/74 mmHg  Pulse 96  Temp(Src) 98.4 F (36.9 C) (Oral)  Resp 18  Ht  (1.753 m)  Wt 169 lb (76.658 kg)  BMI 24.95 kg/m2  SpO2 99%   Dierdre Forth, PA-C 03/26/15 4098  Alvira Monday, MD 03/29/15 1650

## 2015-03-25 NOTE — Discharge Instructions (Signed)
1. Medications: zofran, usual home medications °2. Treatment: rest, drink plenty of fluids, advance diet slowly °3. Follow Up: Please followup with your primary doctor in 2 days for discussion of your diagnoses and further evaluation after today's visit; if you do not have a primary care doctor use the resource guide provided to find one; Please return to the ER for persistent vomiting, high fevers or worsening symptoms ° ° °Nausea and Vomiting °Nausea is a sick feeling that often comes before throwing up (vomiting). Vomiting is a reflex where stomach contents come out of your mouth. Vomiting can cause severe loss of body fluids (dehydration). Children and elderly adults can become dehydrated quickly, especially if they also have diarrhea. Nausea and vomiting are symptoms of a condition or disease. It is important to find the cause of your symptoms. °CAUSES  °· Direct irritation of the stomach lining. This irritation can result from increased acid production (gastroesophageal reflux disease), infection, food poisoning, taking certain medicines (such as nonsteroidal anti-inflammatory drugs), alcohol use, or tobacco use. °· Signals from the brain. These signals could be caused by a headache, heat exposure, an inner ear disturbance, increased pressure in the brain from injury, infection, a tumor, or a concussion, pain, emotional stimulus, or metabolic problems. °· An obstruction in the gastrointestinal tract (bowel obstruction). °· Illnesses such as diabetes, hepatitis, gallbladder problems, appendicitis, kidney problems, cancer, sepsis, atypical symptoms of a heart attack, or eating disorders. °· Medical treatments such as chemotherapy and radiation. °· Receiving medicine that makes you sleep (general anesthetic) during surgery. °DIAGNOSIS °Your caregiver may ask for tests to be done if the problems do not improve after a few days. Tests may also be done if symptoms are severe or if the reason for the nausea and  vomiting is not clear. Tests may include: °· Urine tests. °· Blood tests. °· Stool tests. °· Cultures (to look for evidence of infection). °· X-rays or other imaging studies. °Test results can help your caregiver make decisions about treatment or the need for additional tests. °TREATMENT °You need to stay well hydrated. Drink frequently but in small amounts. You may wish to drink water, sports drinks, clear broth, or eat frozen ice pops or gelatin dessert to help stay hydrated. When you eat, eating slowly may help prevent nausea. There are also some antinausea medicines that may help prevent nausea. °HOME CARE INSTRUCTIONS  °· Take all medicine as directed by your caregiver. °· If you do not have an appetite, do not force yourself to eat. However, you must continue to drink fluids. °· If you have an appetite, eat a normal diet unless your caregiver tells you differently. °¨ Eat a variety of complex carbohydrates (rice, wheat, potatoes, bread), lean meats, yogurt, fruits, and vegetables. °¨ Avoid high-fat foods because they are more difficult to digest. °· Drink enough water and fluids to keep your urine clear or pale yellow. °· If you are dehydrated, ask your caregiver for specific rehydration instructions. Signs of dehydration may include: °¨ Severe thirst. °¨ Dry lips and mouth. °¨ Dizziness. °¨ Dark urine. °¨ Decreasing urine frequency and amount. °¨ Confusion. °¨ Rapid breathing or pulse. °SEEK IMMEDIATE MEDICAL CARE IF:  °· You have blood or brown flecks (like coffee grounds) in your vomit. °· You have black or bloody stools. °· You have a severe headache or stiff neck. °· You are confused. °· You have severe abdominal pain. °· You have chest pain or trouble breathing. °· You do not urinate at least once every 8 hours. °·   You develop cold or clammy skin.  You continue to vomit for longer than 24 to 48 hours.  You have a fever. MAKE SURE YOU:   Understand these instructions.  Will watch your  condition.  Will get help right away if you are not doing well or get worse.   This information is not intended to replace advice given to you by your health care provider. Make sure you discuss any questions you have with your health care provider.   Document Released: 06/04/2005 Document Revised: 08/27/2011 Document Reviewed: 11/01/2010 Elsevier Interactive Patient Education [employer]

## 2015-03-25 NOTE — ED Notes (Signed)
Patient ambulated with minimal assist in the hallway, patient stated he felt a "little woozy" but better than he did.

## 2015-06-20 ENCOUNTER — Other Ambulatory Visit: Payer: Self-pay | Admitting: Family Medicine

## 2015-06-21 MED ORDER — METFORMIN HCL ER 500 MG PO TB24: 500.0000 mg | ORAL_TABLET | Freq: Every day | ORAL | Status: AC

## 2015-06-21 NOTE — Telephone Encounter (Signed)
Relation to OZ:HYQMpt:self Call back number:[phone] Pharmacy: [facility] [id number] [place] [address] [facility] [phone] (Phone) [phone] (Fax)         Reason for call:  Patient requesting a refill metFORMIN (GLUCOPHAGE XR) 500 MG 24 hr tablet, patient states [place] is to far therefore he is inquiring about a [place] closer to home [facility].

## 2015-06-21 NOTE — Telephone Encounter (Signed)
Rx faxed.    KP 

## 2016-07-10 ENCOUNTER — Other Ambulatory Visit: Payer: Self-pay | Admitting: Family Medicine

## 2016-07-10 DIAGNOSIS — E119 Type 2 diabetes mellitus without complications: Secondary | ICD-10-CM

## 2018-12-06 ENCOUNTER — Other Ambulatory Visit: Payer: Self-pay

## 2018-12-06 ENCOUNTER — Emergency Department (HOSPITAL_COMMUNITY)
Admission: EM | Admit: 2018-12-06 | Discharge: 2018-12-06 | Disposition: A | Discharge status: Home / Self Care | Payer: BC Managed Care – PPO | Attending: Emergency Medicine | Admitting: Emergency Medicine

## 2018-12-06 ENCOUNTER — Encounter (HOSPITAL_COMMUNITY): Payer: Self-pay

## 2018-12-06 DIAGNOSIS — I10 Essential (primary) hypertension: Secondary | ICD-10-CM | POA: Diagnosis not present

## 2018-12-06 DIAGNOSIS — R197 Diarrhea, unspecified: Secondary | ICD-10-CM | POA: Insufficient documentation

## 2018-12-06 DIAGNOSIS — Z79899 Other long term (current) drug therapy: Secondary | ICD-10-CM | POA: Diagnosis not present

## 2018-12-06 DIAGNOSIS — R112 Nausea with vomiting, unspecified: Secondary | ICD-10-CM | POA: Diagnosis not present

## 2018-12-06 DIAGNOSIS — E119 Type 2 diabetes mellitus without complications: Secondary | ICD-10-CM | POA: Insufficient documentation

## 2018-12-06 DIAGNOSIS — H6521 Chronic serous otitis media, right ear: Secondary | ICD-10-CM

## 2018-12-06 DIAGNOSIS — Z7984 Long term (current) use of oral hypoglycemic drugs: Secondary | ICD-10-CM | POA: Insufficient documentation

## 2018-12-06 DIAGNOSIS — H9201 Otalgia, right ear: Secondary | ICD-10-CM | POA: Diagnosis present

## 2018-12-06 MED ORDER — ONDANSETRON HCL 4 MG PO TABS: 4.0000 mg | ORAL_TABLET | Freq: Three times a day (TID) | ORAL | 0 refills | Status: AC | PRN

## 2018-12-06 MED ORDER — ONDANSETRON 8 MG PO TBDP
8.0000 mg | ORAL_TABLET | Freq: Once | ORAL | Status: AC
  Administered 2018-12-06: 8 mg via ORAL
  Filled 2018-12-06: qty 1

## 2018-12-06 NOTE — Discharge Instructions (Signed)
You were seen in the emergency department for nausea vomiting diarrhea and a right earache.  You had some fluid behind your right ear that likely is due to allergies.  You can use a decongestant for that.  We prescribing you some nausea medication to help with your nausea symptoms.  Please follow-up with your doctor return if any worsening symptoms.

## 2018-12-06 NOTE — ED Triage Notes (Signed)
He co four episodes of diarrhea since yesterday, plus nausea and feeling "a little weak". He is ambulatory and in no distress.

## 2018-12-06 NOTE — ED Provider Notes (Signed)
[facility]-EMERGENCY DEPT Provider Note   CSN: [id number] Arrival date & time: 12/06/18  1334     History   Chief Complaint Chief Complaint  Patient presents with  . Nausea  . Diarrhea    HPI Kenneth Hancock is a 53 y.o. male.  He said he started with some right ear discomfort that he has had before when his allergies act up.  After eating a [facility] at [facility] [employer] he had an upset stomach and 2 episodes of vomiting and 2 episodes of diarrhea nonbloody.  He said his stomach is just been upset since then but not really painful.  He has been tolerating p.o. today and has had no further episodes but wanted to come here just to get checked out.  He still feels a little nauseous.  No fevers or chills no cough or shortness of breath no headache no body aches.  He is also complaining of a little bit of low back pain that he attributes to lifting things at work.  No urinary symptoms no numbness or weakness.     The history is provided by the patient.  Diarrhea Quality:  Watery Severity:  Moderate Onset quality:  Sudden Number of episodes:  2 Progression:  Resolved Relieved by:  None tried Worsened by:  Nothing Ineffective treatments:  None tried Associated symptoms: vomiting   Associated symptoms: no abdominal pain, no chills, no recent cough, no diaphoresis, no fever and no headaches   Risk factors: no recent antibiotic use, no sick contacts and no travel to endemic areas     Past Medical History:  Diagnosis Date  . Diabetes mellitus without complication (HCC)   . Hyperlipidemia   . Hypertension     Patient Active Problem List   Diagnosis Date Noted  . Type II or unspecified type diabetes mellitus with peripheral circulatory disorders, uncontrolled(250.72) 02/08/2014  . HTN (hypertension) 02/08/2014  . Other and unspecified hyperlipidemia 02/08/2014    Past Surgical History:  Procedure Laterality Date  . NO PAST SURGERIES          Home Medications    Prior to Admission medications   Medication Sig Start Date End Date Taking? Authorizing Provider  chlorthalidone (HYGROTON) 25 MG tablet TK 1 T PO D 07/06/18   [provider]  ibuprofen (ADVIL,MOTRIN) 200 MG tablet Take 400 mg by mouth every 6 (six) hours as needed for fever, headache, mild pain, moderate pain or cramping.    [provider]  JANUMET 50-1000 MG tablet TK 1 T PO BID 10/30/18   [provider]  losartan (COZAAR) 100 MG tablet TK 1 T PO D 09/07/18   [provider]  meclizine (ANTIVERT) 25 MG tablet Take 1 tablet (25 mg total) by mouth 3 (three) times daily as needed for dizziness. 03/25/15   Muthersbaugh, Dahlia ClientHannah, PA-C  metFORMIN (GLUCOPHAGE XR) 500 MG 24 hr tablet Take 1 tablet (500 mg total) by mouth daily with breakfast. 06/21/15   Zola ButtonLowne Chase, Grayling CongressYvonne R, DO  ondansetron (ZOFRAN ODT) 4 MG disintegrating tablet 4mg  ODT q4 hours prn nausea/vomit 03/25/15   Muthersbaugh, Dahlia ClientHannah, PA-C  TRULICITY 0.75 MG/0.5ML SOPN INJECT 1 PEN INTO THE SKIN ONCE A WEEK 11/26/18   [provider]    Family History Family History  Problem Relation Age of Onset  . Breast cancer Mother   . Hypertension Mother   . Diabetes Mother   . Cancer Mother 4672       breast  . Ovarian  cancer Sister   . Cancer Sister 38       ovarian  . Hypertension Sister   . Hyperlipidemia Sister   . Hypertension Father   . Hypertension Brother   . Diabetes Paternal Grandmother   . Diabetes Maternal Aunt     Social History Social History   Tobacco Use  . Smoking status: Never Smoker  Substance Use Topics  . Alcohol use: No  . Drug use: No     Allergies   Patient has no known allergies.   Review of Systems Review of Systems  Constitutional: Negative for chills, diaphoresis and fever.  HENT: Positive for ear pain. Negative for sore throat.   Eyes: Negative for visual disturbance.  Respiratory: Negative for shortness of breath.   Cardiovascular: Negative for chest  pain.  Gastrointestinal: Positive for diarrhea and vomiting. Negative for abdominal pain.  Genitourinary: Negative for dysuria.  Musculoskeletal: Positive for back pain.  Skin: Negative for rash.  Neurological: Negative for headaches.     Physical Exam Updated Vital Signs BP (!) 148/85 (BP Location: Left Arm)   Pulse (!) 110   Temp 98.7 F (37.1 C) (Oral)   Resp 18   SpO2 99%   Physical Exam Vitals signs and nursing note reviewed.  Constitutional:      Appearance: He is well-developed.  HENT:     Head: Normocephalic and atraumatic.     Right Ear: Ear canal normal. A middle ear effusion is present. Tympanic membrane is not injected, perforated or erythematous.     Left Ear: Ear canal normal.  Eyes:     Conjunctiva/sclera: Conjunctivae normal.  Neck:     Musculoskeletal: Neck supple.  Cardiovascular:     Rate and Rhythm: Regular rhythm. Tachycardia present.     Heart sounds: No murmur.  Pulmonary:     Effort: Pulmonary effort is normal. No respiratory distress.     Breath sounds: Normal breath sounds.  Abdominal:     Palpations: Abdomen is soft.     Tenderness: There is no abdominal tenderness.  Musculoskeletal: Normal range of motion.     Right lower leg: No edema.     Left lower leg: No edema.  Skin:    General: Skin is warm and dry.     Capillary Refill: Capillary refill takes less than 2 seconds.  Neurological:     General: No focal deficit present.     Mental Status: He is alert and oriented to person, place, and time.     Sensory: No sensory deficit.     Motor: No weakness.     Gait: Gait normal.      ED Treatments / Results  Labs (all labs ordered are listed, but only abnormal results are displayed) Labs Reviewed - No data to display  EKG    Radiology No results found.  Procedures Procedures (including critical care time)  Medications Ordered in ED Medications  ondansetron (ZOFRAN-ODT) disintegrating tablet 8 mg (has no administration in  time range)     Initial Impression / Assessment and Plan / ED Course  I have reviewed the triage vital signs and the nursing notes.  Pertinent labs & imaging results that were available during my care of the patient were reviewed by me and considered in my medical decision making (see chart for details).         Final Clinical Impressions(s) / ED Diagnoses   Final diagnoses:  Nausea vomiting and diarrhea  Right chronic serous otitis media  ED Discharge Orders    None       Terrilee FilesButler, Mertis Mosher C, MD 12/06/18 2056

## 2018-12-06 NOTE — ED Notes (Signed)
Ginger ale given for PO challenge.

## 2019-08-15 ENCOUNTER — Ambulatory Visit: Payer: BC Managed Care – PPO | Attending: Internal Medicine

## 2019-08-15 DIAGNOSIS — Z23 Encounter for immunization: Secondary | ICD-10-CM | POA: Insufficient documentation

## 2019-08-15 NOTE — Progress Notes (Addendum)
   Covid-19 Vaccination Clinic  Name:  Haider Hornaday    MRN: [id number] DOB: [birth year]  08/15/2019  Mr. Duerson was observed post Covid-19 immunization for 30 minutes.  At 1655:  Pt states "I'm feeling cold and sweaty" BP 135/99 HR 96 RR 18 O2 Sat 98%  Pt. Was offered crackers and water.  1702: After eating crackers and drinking some water,  Pt states "I'm feeling a little better" 147/90 HR 95 RR 18 O2 sat 99%  1705 Pt states that "I was sweaty on my way in here today" Pt states that he feels better  1712  BP 136/93 HR 87 RR 18 O2 Sat 99%    He was provided with Vaccine Information Sheet and instruction to access the V-Safe system.   Mr. Bacci was instructed to call 911 with any severe reactions post vaccine: Marland Kitchen Difficulty breathing  . Swelling of your face and throat  . A fast heartbeat  . A bad rash all over your body  . Dizziness and weakness    Immunizations Administered    Name Date Dose VIS Date Route   Pfizer COVID-19 Vaccine 08/15/2019  4:39 PM 0.3 mL 05/29/2019 Intramuscular   Manufacturer: [employer], [employer]   Lot: [id number]   NDC: [address]-3

## 2019-09-05 ENCOUNTER — Ambulatory Visit: Payer: BC Managed Care – PPO | Attending: Internal Medicine

## 2019-09-05 DIAGNOSIS — Z23 Encounter for immunization: Secondary | ICD-10-CM

## 2019-09-05 NOTE — Progress Notes (Signed)
   Covid-19 Vaccination Clinic  Name:  Kentley Blyden    MRN: [id number] DOB: 1966-02-06  09/05/2019  Mr. Carias was observed post Covid-19 immunization for 15 minutes without incident. He was provided with Vaccine Information Sheet and instruction to access the V-Safe system.   Mr. Wiley was instructed to call 911 with any severe reactions post vaccine: Marland Kitchen Difficulty breathing  . Swelling of face and throat  . A fast heartbeat  . A bad rash all over body  . Dizziness and weakness   Immunizations Administered    Name Date Dose VIS Date Route   Pfizer COVID-19 Vaccine 09/05/2019 10:40 AM 0.3 mL 05/29/2019 Intramuscular   Manufacturer: [employer], [employer]   Lot: [id number]   NDC: [address]-9

## 2019-09-09 ENCOUNTER — Ambulatory Visit: Payer: BC Managed Care – PPO

## 2020-06-21 ENCOUNTER — Encounter (HOSPITAL_COMMUNITY): Payer: Self-pay | Admitting: Emergency Medicine

## 2020-06-21 ENCOUNTER — Emergency Department (HOSPITAL_COMMUNITY)
Admission: EM | Admit: 2020-06-21 | Discharge: 2020-06-21 | Disposition: A | Discharge status: Home / Self Care | Payer: BC Managed Care – PPO | Attending: Emergency Medicine | Admitting: Emergency Medicine

## 2020-06-21 ENCOUNTER — Emergency Department (HOSPITAL_COMMUNITY): Payer: BC Managed Care – PPO

## 2020-06-21 DIAGNOSIS — Z7984 Long term (current) use of oral hypoglycemic drugs: Secondary | ICD-10-CM | POA: Diagnosis not present

## 2020-06-21 DIAGNOSIS — I10 Essential (primary) hypertension: Secondary | ICD-10-CM | POA: Diagnosis not present

## 2020-06-21 DIAGNOSIS — N3001 Acute cystitis with hematuria: Secondary | ICD-10-CM | POA: Insufficient documentation

## 2020-06-21 DIAGNOSIS — E1151 Type 2 diabetes mellitus with diabetic peripheral angiopathy without gangrene: Secondary | ICD-10-CM | POA: Insufficient documentation

## 2020-06-21 DIAGNOSIS — Z79899 Other long term (current) drug therapy: Secondary | ICD-10-CM | POA: Insufficient documentation

## 2020-06-21 DIAGNOSIS — N3091 Cystitis, unspecified with hematuria: Secondary | ICD-10-CM

## 2020-06-21 DIAGNOSIS — R300 Dysuria: Secondary | ICD-10-CM | POA: Diagnosis present

## 2020-06-21 LAB — BASIC METABOLIC PANEL
Anion gap: 10 (ref 5–15)
BUN: 18 mg/dL (ref 6–20)
CO2: 29 mmol/L (ref 22–32)
Calcium: 9.5 mg/dL (ref 8.9–10.3)
Chloride: 98 mmol/L (ref 98–111)
Creatinine, Ser: 1.12 mg/dL (ref 0.61–1.24)
GFR, Estimated: >60 mL/min (ref >60)
Glucose, Bld: 141 mg/dL — ABNORMAL HIGH (ref 70–99)
Potassium: 3.8 mmol/L (ref 3.5–5.1)
Sodium: 137 mmol/L (ref 135–145)

## 2020-06-21 LAB — CBC
HCT: 47.2 % (ref 39.0–52.0)
Hemoglobin: 16 g/dL (ref 13.0–17.0)
MCH: 29.1 pg (ref 26.0–34.0)
MCHC: 33.9 g/dL (ref 30.0–36.0)
MCV: 85.8 fL (ref 80.0–100.0)
Platelets: 323 10*3/uL (ref 150–400)
RBC: 5.5 MIL/uL (ref 4.22–5.81)
RDW: 13 % (ref 11.5–15.5)
WBC: 9.8 10*3/uL (ref 4.0–10.5)
nRBC: 0 % (ref 0.0–0.2)

## 2020-06-21 LAB — URINALYSIS, ROUTINE W REFLEX MICROSCOPIC
Glucose, UA: 100 mg/dL — AB
Ketones, ur: NEGATIVE mg/dL
Nitrite: POSITIVE — AB
Protein, ur: 100 mg/dL — AB
Specific Gravity, Urine: 1.02 (ref 1.005–1.030)
pH: 6 (ref 5.0–8.0)

## 2020-06-21 LAB — URINALYSIS, MICROSCOPIC (REFLEX): Squamous Epithelial / LPF: NONE SEEN (ref 0–5)

## 2020-06-21 MED ORDER — CEPHALEXIN 500 MG PO CAPS: 500.0000 mg | ORAL_CAPSULE | Freq: Two times a day (BID) | ORAL | 0 refills | Status: AC

## 2020-06-21 MED ORDER — CEPHALEXIN 500 MG PO CAPS
500.0000 mg | ORAL_CAPSULE | Freq: Once | ORAL | Status: AC
  Administered 2020-06-21: 500 mg via ORAL
  Filled 2020-06-21: qty 1

## 2020-06-21 NOTE — ED Triage Notes (Signed)
Per pt, states blood in urine and lower back pain since 4 am-urine hesitancy-no other symptoms

## 2020-06-21 NOTE — Discharge Instructions (Signed)
As discussed, your evaluation today has been largely reassuring.  But, it is important that you monitor your condition carefully, and do not hesitate to return to the ED if you develop new, or concerning changes in your condition. ? ?Otherwise, please follow-up with your physician for appropriate ongoing care. ? ?

## 2020-06-21 NOTE — ED Provider Notes (Signed)
[facility]-EMERGENCY DEPT Provider Note   CSN: [id number] Arrival date & time: 06/21/20  0557     History Chief Complaint  Patient presents with  . Hematuria    Kenneth Hancock is a 55 y.o. male.  HPI Patient presents with hematuria, mild dysuria.  Onset was today, upon awakening.  He continues to produce urine, but notes that initially, then less so subsequently, there was gross hematuria. No substantial suprapubic discomfort, flank pain, no chest pain, no nausea, vomiting, fever, chills. No clear alleviating, exacerbating or precipitating factors.    Past Medical History:  Diagnosis Date  . Diabetes mellitus without complication (HCC)   . Hyperlipidemia   . Hypertension     Patient Active Problem List   Diagnosis Date Noted  . Type II or unspecified type diabetes mellitus with peripheral circulatory disorders, uncontrolled(250.72) 02/08/2014  . HTN (hypertension) 02/08/2014  . Other and unspecified hyperlipidemia 02/08/2014    Past Surgical History:  Procedure Laterality Date  . NO PAST SURGERIES         Family History  Problem Relation Age of Onset  . Breast cancer Mother   . Hypertension Mother   . Diabetes Mother   . Cancer Mother 57       breast  . Ovarian cancer Sister   . Cancer Sister 44       ovarian  . Hypertension Sister   . Hyperlipidemia Sister   . Hypertension Father   . Hypertension Brother   . Diabetes Paternal Grandmother   . Diabetes Maternal Aunt     Social History   Tobacco Use  . Smoking status: Never Smoker  Substance Use Topics  . Alcohol use: No  . Drug use: No    Home Medications Prior to Admission medications   Medication Sig Start Date End Date Taking? Authorizing Provider  amLODipine (NORVASC) 10 MG tablet Take 10 mg by mouth daily. 06/02/20  Yes [provider]  atorvastatin (LIPITOR) 40 MG tablet Take 40 mg by mouth daily. 06/02/20  Yes [provider]  chlorthalidone (HYGROTON)  25 MG tablet Take 25 mg by mouth daily.  07/06/18  Yes [provider]  flunisolide (NASALIDE) 25 MCG/ACT (0.025%) SOLN Place 1-2 sprays into the nose daily as needed (allergies). 06/09/20  Yes [provider]  JANUMET 50-1000 MG tablet Take 1 tablet by mouth 2 (two) times daily with a meal.  10/30/18  Yes [provider]  losartan (COZAAR) 100 MG tablet Take 100 mg by mouth daily.  09/07/18  Yes [provider]  Vitamin D, Ergocalciferol, (DRISDOL) 1.25 MG (50000 UNIT) CAPS capsule Take 50,000 Units by mouth once a week. Take on Monday 06/02/20  Yes [provider]  meclizine (ANTIVERT) 25 MG tablet Take 1 tablet (25 mg total) by mouth 3 (three) times daily as needed for dizziness. Patient not taking: No sig reported 03/25/15   Muthersbaugh, Dahlia Client, PA-C  metFORMIN (GLUCOPHAGE XR) 500 MG 24 hr tablet Take 1 tablet (500 mg total) by mouth daily with breakfast. Patient not taking: No sig reported 06/21/15   Zola Button, Grayling Congress, DO  ondansetron (ZOFRAN ODT) 4 MG disintegrating tablet 4mg  ODT q4 hours prn nausea/vomit Patient not taking: No sig reported 03/25/15   Muthersbaugh, 05/25/15, PA-C  ondansetron (ZOFRAN) 4 MG tablet Take 1 tablet (4 mg total) by mouth every 8 (eight) hours as needed for nausea or vomiting. Patient not taking: Reported on 06/21/2020 12/06/18   12/08/18, MD  TRULICITY  4.5 MG/0.5ML SOPN Inject 4.5 mg into the skin once a week. Take on Sunday 06/02/20   [provider]    Allergies    Patient has no known allergies.  Review of Systems   Review of Systems  Constitutional:       Per HPI, otherwise negative  HENT:       Per HPI, otherwise negative  Respiratory:       Per HPI, otherwise negative  Cardiovascular:       Per HPI, otherwise negative  Gastrointestinal: Negative for vomiting.  Endocrine:       Negative aside from HPI  Genitourinary:       Neg aside from HPI   Musculoskeletal:       Per HPI, otherwise  negative  Skin: Negative.   Neurological: Negative for syncope.    Physical Exam Updated Vital Signs BP (!) 111/96   Pulse 82   Temp 98.3 F (36.8 C) (Oral)   Resp 16   SpO2 99%   Physical Exam Vitals and nursing note reviewed.  Constitutional:      General: He is not in acute distress.    Appearance: He is well-developed.  HENT:     Head: Normocephalic and atraumatic.  Eyes:     Extraocular Movements: EOM normal.     Conjunctiva/sclera: Conjunctivae normal.  Cardiovascular:     Rate and Rhythm: Normal rate and regular rhythm.  Pulmonary:     Effort: Pulmonary effort is normal. No respiratory distress.     Breath sounds: No stridor.  Abdominal:     General: There is no distension.  Musculoskeletal:        General: No edema.  Skin:    General: Skin is warm and dry.  Neurological:     Mental Status: He is alert and oriented to person, place, and time.  Psychiatric:        Mood and Affect: Mood and affect normal.     ED Results / Procedures / Treatments   Labs (all labs ordered are listed, but only abnormal results are displayed) Labs Reviewed  URINALYSIS, ROUTINE W REFLEX MICROSCOPIC - Abnormal; Notable for the following components:      Result Value   Color, Urine ORANGE (*)    APPearance CLOUDY (*)    Glucose, UA 100 (*)    Hgb urine dipstick LARGE (*)    Bilirubin Urine SMALL (*)    Protein, ur 100 (*)    Nitrite POSITIVE (*)    Leukocytes,Ua MODERATE (*)    All other components within normal limits  BASIC METABOLIC PANEL - Abnormal; Notable for the following components:   Glucose, Bld 141 (*)    All other components within normal limits  URINALYSIS, MICROSCOPIC (REFLEX) - Abnormal; Notable for the following components:   Bacteria, UA FEW (*)    All other components within normal limits  CBC    EKG None  Radiology CT Renal Stone Study  Result Date: 06/21/2020 CLINICAL DATA:  Hematuria and low back pain EXAM: CT ABDOMEN AND PELVIS WITHOUT  CONTRAST TECHNIQUE: Multidetector CT imaging of the abdomen and pelvis was performed following the standard protocol without IV contrast. COMPARISON:  None. FINDINGS: Lower chest: No acute abnormality. Hepatobiliary: No focal liver abnormality is seen. No gallstones, gallbladder wall thickening, or biliary dilatation. Pancreas: Unremarkable. No pancreatic ductal dilatation or surrounding inflammatory changes. Spleen: Normal in size without focal abnormality. Adrenals/Urinary Tract: Adrenal glands are unremarkable. Kidneys are normal, without renal calculi, focal lesion,  or hydronephrosis. Bladder is slightly thick walled, but no surrounding inflammatory change. Stomach/Bowel: Stomach is within normal limits. Appendix appears normal. No evidence of bowel wall thickening, distention, or inflammatory changes. Vascular/Lymphatic: No significant vascular findings are present. No enlarged abdominal or pelvic lymph nodes. Reproductive: Prostate is unremarkable. Other: No abdominal wall hernia or abnormality. No abdominopelvic ascites. Small fat containing umbilical hernia. Musculoskeletal: No acute or significant osseous findings. IMPRESSION: Negative for hydronephrosis or ureteral stone. Slightly thick-walled appearance of the urinary bladder is questionable for cystitis, no significant surrounding soft tissue stranding or inflammation. Electronically Signed   By: Donavan Foil M.D.   On: 06/21/2020 20:54    Procedures Procedures (including critical care time)  Medications Ordered in ED Medications  cephALEXin (KEFLEX) capsule 500 mg (has no administration in time range)    ED Course  I have reviewed the triage vital signs and the nursing notes.  Pertinent labs & imaging results that were available during my care of the patient were reviewed by me and considered in my medical decision making (see chart for details).    MDM Rules/Calculators/A&P I have reviewed the CT imaging, agree with  interpretation. Similarly I have reviewed the labs.  9:40 PM On repeat exam the patient is awake, alert, in no distress, watching television.  He has no ongoing complaints.  We discussed today's findings including CT without evidence for perinephric abscess, hydronephrosis, stones.  CT, urinalysis consistent with acute cystitis, with hematuria. Patient was started antibiotics, will follow up with primary care.  Final Clinical Impression(s) / ED Diagnoses Final diagnoses:  Hemorrhagic cystitis    Rx / DC Orders ED Discharge Orders         Ordered    cephALEXin (KEFLEX) 500 MG capsule  2 times daily        06/21/20 2142           Carmin Muskrat, MD 06/21/20 2143

## 2021-01-25 ENCOUNTER — Ambulatory Visit: Payer: BC Managed Care – PPO | Admitting: Podiatry

## 2021-03-22 ENCOUNTER — Ambulatory Visit: Payer: BC Managed Care – PPO | Admitting: Podiatry

## 2021-04-14 ENCOUNTER — Other Ambulatory Visit: Payer: Self-pay

## 2021-04-14 ENCOUNTER — Ambulatory Visit: Payer: BC Managed Care – PPO | Admitting: Podiatry

## 2021-04-14 DIAGNOSIS — Q666 Other congenital valgus deformities of feet: Secondary | ICD-10-CM

## 2021-04-14 DIAGNOSIS — E119 Type 2 diabetes mellitus without complications: Secondary | ICD-10-CM | POA: Diagnosis not present

## 2021-04-14 NOTE — Progress Notes (Signed)
Subjective: Kenneth Hancock presents today referred by Knox Royalty, MD for diabetic foot evaluation.  Patient relates since 2015 year history of diabetes.  Patient denies any history of foot wounds.  Patient denies any history of numbness, tingling, burning, pins/needles sensations.  Patient would also like to discuss orthotics  Past Medical History:  Diagnosis Date   Diabetes mellitus without complication (HCC)    Hyperlipidemia    Hypertension     Patient Active Problem List   Diagnosis Date Noted   Type II or unspecified type diabetes mellitus with peripheral circulatory disorders, uncontrolled(250.72) 02/08/2014   HTN (hypertension) 02/08/2014   Other and unspecified hyperlipidemia 02/08/2014    Past Surgical History:  Procedure Laterality Date   NO PAST SURGERIES      Current Outpatient Medications on File Prior to Visit  Medication Sig Dispense Refill   amLODipine (NORVASC) 10 MG tablet Take 10 mg by mouth daily.     atorvastatin (LIPITOR) 40 MG tablet Take 40 mg by mouth daily.     chlorthalidone (HYGROTON) 25 MG tablet Take 25 mg by mouth daily.      flunisolide (NASALIDE) 25 MCG/ACT (0.025%) SOLN Place 1-2 sprays into the nose daily as needed (allergies).     JANUMET 50-1000 MG tablet Take 1 tablet by mouth 2 (two) times daily with a meal.      losartan (COZAAR) 100 MG tablet Take 100 mg by mouth daily.      meclizine (ANTIVERT) 25 MG tablet Take 1 tablet (25 mg total) by mouth 3 (three) times daily as needed for dizziness. (Patient not taking: No sig reported) 20 tablet 0   metFORMIN (GLUCOPHAGE XR) 500 MG 24 hr tablet Take 1 tablet (500 mg total) by mouth daily with breakfast. (Patient not taking: No sig reported) 30 tablet 2   ondansetron (ZOFRAN ODT) 4 MG disintegrating tablet 4mg  ODT q4 hours prn nausea/vomit (Patient not taking: No sig reported) 12 tablet 0   ondansetron (ZOFRAN) 4 MG tablet Take 1 tablet (4 mg total) by mouth every 8 (eight) hours as needed for  nausea or vomiting. (Patient not taking: Reported on 06/21/2020) 15 tablet 0   TRULICITY 4.5 MG/0.5ML SOPN Inject 4.5 mg into the skin once a week. Take on Sunday     Vitamin D, Ergocalciferol, (DRISDOL) 1.25 MG (50000 UNIT) CAPS capsule Take 50,000 Units by mouth once a week. Take on Monday     No current facility-administered medications on file prior to visit.     No Known Allergies  Social History   Occupational History    Employer: [employer]    Comment: custodian  Tobacco Use   Smoking status: Never   Smokeless tobacco: Not on file  Substance and Sexual Activity   Alcohol use: No   Drug use: No   Sexual activity: Yes    Partners: Female    Family History  Problem Relation Age of Onset   Breast cancer Mother    Hypertension Mother    Diabetes Mother    Cancer Mother 66       breast   Ovarian cancer Sister    Cancer Sister 49       ovarian   Hypertension Sister    Hyperlipidemia Sister    Hypertension Father    Hypertension Brother    Diabetes Paternal Grandmother    Diabetes Maternal Aunt     Immunization History  Administered Date(s) Administered   PFIZER(Purple Top)SARS-COV-2 Vaccination 08/15/2019, 09/05/2019   Pneumococcal Polysaccharide-23  02/08/2014   Tdap 02/08/2014    Review of systems: Positive Findings in bold print.  Constitutional:  chills, fatigue, fever, sweats, weight change Communication: Nurse, learning disability, sign Presenter, broadcasting, hand writing, iPad/Android device Head: headaches, head injury Eyes: changes in vision, eye pain, glaucoma, cataracts, macular degeneration, diplopia, glare,  light sensitivity, eyeglasses or contacts, blindness Ears nose mouth throat: hearing impaired, hearing aids,  ringing in ears, deaf, sign language,  vertigo, nosebleeds,  rhinitis,  cold sores, snoring, swollen glands Cardiovascular: HTN, edema, arrhythmia, pacemaker in place, defibrillator in place, chest pain/tightness, chronic anticoagulation, blood  clot, heart failure, MI Peripheral Vascular: leg cramps, varicose veins, blood clots, lymphedema, varicosities Respiratory:  asthma, difficulty breathing, denies congestion, SOB, wheezing, cough, emphysema Gastrointestinal: change in appetite or weight, abdominal pain, constipation, diarrhea, nausea, vomiting, vomiting blood, change in bowel habits, abdominal pain, jaundice, rectal bleeding, hemorrhoids, GERD Genitourinary:  nocturia,  pain on urination, polyuria,  blood in urine, Foley catheter, urinary urgency, ESRD on hemodialysis Musculoskeletal: amputation, cramping, stiff joints, painful joints, decreased joint motion, fractures, OA, gout, hemiplegia, paraplegia, uses cane, wheelchair bound, uses walker, uses rollator Skin: +changes in toenails, color change, dryness, itching, mole changes,  rash, wound(s) Neurological: headaches, numbness in feet, paresthesias in feet, burning in feet, fainting,  seizures, change in speech, migraines, memory problems/poor historian, cerebral palsy, weakness, paralysis, CVA, TIA Endocrine: diabetes, hypothyroidism, hyperthyroidism,  goiter, dry mouth, flushing, heat intolerance, cold intolerance,  excessive thirst, denies polyuria,  nocturia Hematological:  easy bleeding, excessive bleeding, easy bruising, enlarged lymph nodes, on long term blood thinner, history of past transusions Allergy/immunological:  hives, eczema, frequent infections, multiple drug allergies, seasonal allergies, transplant recipient, multiple food allergies Psychiatric:  anxiety, depression, mood disorder, suicidal ideations, hallucinations, insomnia  Objective: There were no vitals filed for this visit. Vascular Examination: Capillary refill time less than 3 seconds x 10 digits.  Dorsalis pedis pulses palpable 2 out of 4.  Posterior tibial pulses palpable 2 out of 4.  Digital hair none x 10 digits.  Skin temperature gradient WNL b/l.  Dermatological Examination: Skin with  normal turgor, texture and tone b/l  Toenails 1-5 b/l discolored, thick, dystrophic with subungual debris and pain with palpation to nailbeds due to thickness of nails.  Musculoskeletal: Muscle strength 5/5 to all LE muscle groups.  Neurological: Sensation intact with 10 gram monofilament.  Vibratory sensation intact.  Assessment: NIDDM Encounter for diabetic foot examination Pes planovalgus  Plan: Discussed diabetic foot care principles. Literature dispensed on today. Patient to continue soft, supportive shoe gear daily. Patient to report any pedal injuries to medical professional immediately. Follow up one year. Patient/POA to call should there be a concern in the interim. I explained to the patient the etiology of pes planovalgus and relationship with diabetes emerged treatment options were discussed patient will benefit from custom-made orthotics to help control the hindfoot motion support the arch of his foot.

## 2021-05-10 ENCOUNTER — Other Ambulatory Visit: Payer: Self-pay

## 2021-05-10 ENCOUNTER — Ambulatory Visit: Payer: BC Managed Care – PPO

## 2021-05-10 DIAGNOSIS — Q666 Other congenital valgus deformities of feet: Secondary | ICD-10-CM

## 2021-05-10 NOTE — Progress Notes (Signed)
SITUATION: Reason for Visit: Fitting and Delivery of Custom Fabricated Foot Orthoses Patient Report: Patient reports comfort and is satisfied with device.  OBJECTIVE DATA: Patient History / Diagnosis:  No change in pathology Provided Device:  Custom functional foot orthoses  GOAL OF ORTHOSIS - Improve gait - Decrease energy expenditure - Improve Balance - Provide Triplanar stability of foot complex - Facilitate motion  ACTIONS PERFORMED Patient was fit with Custom functional foot orthoses trimmed to shoe last. Patient tolerated fittign procedure. Device was modified as follows to better fit patient: - Toe plate was trimmed to shoe last  Patient was provided with verbal and written instruction and demonstration regarding donning, doffing, wear, care, proper fit, function, purpose, cleaning, and use of the orthosis and in all related precautions and risks and benefits regarding the orthosis.  Patient was also provided with verbal instruction regarding how to report any failures or malfunctions of the orthosis and necessary follow up care. Patient was also instructed to contact our office regarding any change in status that may affect the function of the orthosis.  Patient demonstrated independence with proper donning, doffing, and fit and verbalized understanding of all instructions.  PLAN: Patient is to follow up in one week or as necessary (PRN). All questions were answered and concerns addressed. Plan of care was discussed with and agreed upon by the patient.

## 2021-09-09 ENCOUNTER — Emergency Department (HOSPITAL_BASED_OUTPATIENT_CLINIC_OR_DEPARTMENT_OTHER): Payer: BC Managed Care – PPO

## 2021-09-09 ENCOUNTER — Encounter (HOSPITAL_BASED_OUTPATIENT_CLINIC_OR_DEPARTMENT_OTHER): Payer: Self-pay

## 2021-09-09 ENCOUNTER — Emergency Department (HOSPITAL_BASED_OUTPATIENT_CLINIC_OR_DEPARTMENT_OTHER)
Admission: EM | Admit: 2021-09-09 | Discharge: 2021-09-09 | Disposition: A | Discharge status: Home / Self Care | Payer: BC Managed Care – PPO | Attending: Emergency Medicine | Admitting: Emergency Medicine

## 2021-09-09 ENCOUNTER — Other Ambulatory Visit: Payer: Self-pay

## 2021-09-09 DIAGNOSIS — N3091 Cystitis, unspecified with hematuria: Secondary | ICD-10-CM

## 2021-09-09 DIAGNOSIS — Z79899 Other long term (current) drug therapy: Secondary | ICD-10-CM | POA: Insufficient documentation

## 2021-09-09 DIAGNOSIS — R319 Hematuria, unspecified: Secondary | ICD-10-CM | POA: Diagnosis present

## 2021-09-09 LAB — URINALYSIS, ROUTINE W REFLEX MICROSCOPIC
Bilirubin Urine: NEGATIVE
Glucose, UA: 500 mg/dL — AB
Ketones, ur: NEGATIVE mg/dL
Nitrite: NEGATIVE
RBC / HPF: >50 RBC/hpf — ABNORMAL HIGH (ref 0–5)
Specific Gravity, Urine: 1.012 (ref 1.005–1.030)
pH: 6 (ref 5.0–8.0)

## 2021-09-09 LAB — CBC WITH DIFFERENTIAL/PLATELET
Abs Immature Granulocytes: 0.02 10*3/uL (ref 0.00–0.07)
Basophils Absolute: 0 10*3/uL (ref 0.0–0.1)
Basophils Relative: 1 %
Eosinophils Absolute: 0.1 10*3/uL (ref 0.0–0.5)
Eosinophils Relative: 1 %
HCT: 47.7 % (ref 39.0–52.0)
Hemoglobin: 16 g/dL (ref 13.0–17.0)
Immature Granulocytes: 0 %
Lymphocytes Relative: 22 %
Lymphs Abs: 1.7 10*3/uL (ref 0.7–4.0)
MCH: 28.9 pg (ref 26.0–34.0)
MCHC: 33.5 g/dL (ref 30.0–36.0)
MCV: 86.1 fL (ref 80.0–100.0)
Monocytes Absolute: 0.7 10*3/uL (ref 0.1–1.0)
Monocytes Relative: 9 %
Neutro Abs: 5.3 10*3/uL (ref 1.7–7.7)
Neutrophils Relative %: 67 %
Platelets: 314 10*3/uL (ref 150–400)
RBC: 5.54 MIL/uL (ref 4.22–5.81)
RDW: 13 % (ref 11.5–15.5)
WBC: 7.9 10*3/uL (ref 4.0–10.5)
nRBC: 0 % (ref 0.0–0.2)

## 2021-09-09 LAB — BASIC METABOLIC PANEL
Anion gap: 9 (ref 5–15)
BUN: 16 mg/dL (ref 6–20)
CO2: 28 mmol/L (ref 22–32)
Calcium: 9.2 mg/dL (ref 8.9–10.3)
Chloride: 96 mmol/L — ABNORMAL LOW (ref 98–111)
Creatinine, Ser: 1.15 mg/dL (ref 0.61–1.24)
GFR, Estimated: >60 mL/min (ref >60)
Glucose, Bld: 231 mg/dL — ABNORMAL HIGH (ref 70–99)
Potassium: 3.5 mmol/L (ref 3.5–5.1)
Sodium: 133 mmol/L — ABNORMAL LOW (ref 135–145)

## 2021-09-09 MED ORDER — CEPHALEXIN 500 MG PO CAPS: 1000.0000 mg | ORAL_CAPSULE | Freq: Two times a day (BID) | ORAL | 0 refills | Status: AC

## 2021-09-09 NOTE — ED Triage Notes (Signed)
Onset today blood in urine x 3.  No urinary symptoms.  Has some pain to back ?

## 2021-09-09 NOTE — ED Provider Notes (Signed)
?[facility] EMERGENCY DEPT ?Provider Note ? ? ?CSN: [id number] ?Arrival date & time: 09/09/21  8032 ? ?  ? ?History ? ?Chief Complaint  ?Patient presents with  ? Hematuria  ? ? ?Kenneth Hancock is a 56 y.o. male. ? ?56 year old male presents with hematuria x1 day.  Denies any fever or flank pain.  No suprapubic fullness.  Denies any dysuria or hematuria.  States that he had similar symptoms a year ago but is unsure of the etiology.  States he did see a urologist but is unsure if he had a cystoscopy.  He denies any blood thinner use at this time.  Denies any other bleeding or bruising. ? ? ?  ? ?Home Medications ?Prior to Admission medications   ?Medication Sig Start Date End Date Taking? Authorizing Provider  ?amLODipine (NORVASC) 10 MG tablet Take 10 mg by mouth daily. 06/02/20   [provider]  ?atorvastatin (LIPITOR) 40 MG tablet Take 40 mg by mouth daily. 06/02/20   [provider]  ?chlorthalidone (HYGROTON) 25 MG tablet Take 25 mg by mouth daily.  07/06/18   [provider]  ?flunisolide (NASALIDE) 25 MCG/ACT (0.025%) SOLN Place 1-2 sprays into the nose daily as needed (allergies). 06/09/20   [provider]  ?JANUMET 50-1000 MG tablet Take 1 tablet by mouth 2 (two) times daily with a meal.  10/30/18   [provider]  ?losartan (COZAAR) 100 MG tablet Take 100 mg by mouth daily.  09/07/18   [provider]  ?meclizine (ANTIVERT) 25 MG tablet Take 1 tablet (25 mg total) by mouth 3 (three) times daily as needed for dizziness. ?Patient not taking: No sig reported 03/25/15   Muthersbaugh, Dahlia Client, PA-C  ?metFORMIN (GLUCOPHAGE XR) 500 MG 24 hr tablet Take 1 tablet (500 mg total) by mouth daily with breakfast. ?Patient not taking: No sig reported 06/21/15   Zola Button, Grayling Congress, DO  ?ondansetron (ZOFRAN ODT) 4 MG disintegrating tablet 4mg  ODT q4 hours prn nausea/vomit ?Patient not taking: No sig reported 03/25/15   Muthersbaugh, 05/25/15, PA-C  ?ondansetron  (ZOFRAN) 4 MG tablet Take 1 tablet (4 mg total) by mouth every 8 (eight) hours as needed for nausea or vomiting. ?Patient not taking: Reported on 06/21/2020 12/06/18   12/08/18, MD  ?TRULICITY 4.5 MG/0.5ML SOPN Inject 4.5 mg into the skin once a week. Take on Sunday 06/02/20   [provider]  ?Vitamin D, Ergocalciferol, (DRISDOL) 1.25 MG (50000 UNIT) CAPS capsule Take 50,000 Units by mouth once a week. Take on Monday 06/02/20   [provider]  ?   ? ?Allergies    ?Patient has no known allergies.   ? ?Review of Systems   ?Review of Systems  ?All other systems reviewed and are negative. ? ?Physical Exam ?Updated Vital Signs ?BP (!) 149/99 (BP Location: Right Arm)   Pulse 79   Temp 98.3 ?F (36.8 ?C) (Oral)   Resp 18   Ht 1.753 m (5\' 9" )   Wt 74.8 kg   SpO2 98%   BMI 24.37 kg/m?  ?Physical Exam ?Vitals and nursing note reviewed. Exam conducted with a chaperone present.  ?Constitutional:   ?   General: He is not in acute distress. ?   Appearance: Normal appearance. He is well-developed. He is not toxic-appearing.  ?HENT:  ?   Head: Normocephalic and atraumatic.  ?Eyes:  ?   General: Lids are normal.  ?   Conjunctiva/sclera: Conjunctivae normal.  ?   Pupils: Pupils are equal,  round, and reactive to light.  ?Neck:  ?   Thyroid: No thyroid mass.  ?   Trachea: No tracheal deviation.  ?Cardiovascular:  ?   Rate and Rhythm: Normal rate and regular rhythm.  ?   Heart sounds: Normal heart sounds. No murmur heard. ?  No gallop.  ?Pulmonary:  ?   Effort: Pulmonary effort is normal. No respiratory distress.  ?   Breath sounds: Normal breath sounds. No stridor. No decreased breath sounds, wheezing, rhonchi or rales.  ?Abdominal:  ?   General: There is no distension.  ?   Palpations: Abdomen is soft.  ?   Tenderness: There is no abdominal tenderness. There is no rebound.  ?Genitourinary: ?   Penis: Uncircumcised. No discharge.   ?Musculoskeletal:     ?   General: No tenderness. Normal range of motion.   ?   Cervical back: Normal range of motion and neck supple.  ?Skin: ?   General: Skin is warm and dry.  ?   Findings: No abrasion or rash.  ?Neurological:  ?   Mental Status: He is alert and oriented to person, place, and time. Mental status is at baseline.  ?   GCS: GCS eye subscore is 4. GCS verbal subscore is 5. GCS motor subscore is 6.  ?   Cranial Nerves: No cranial nerve deficit.  ?   Sensory: No sensory deficit.  ?   Motor: Motor function is intact.  ?Psychiatric:     ?   Attention and Perception: Attention normal.     ?   Speech: Speech normal.     ?   Behavior: Behavior normal.  ? ? ?ED Results / Procedures / Treatments   ?Labs ?(all labs ordered are listed, but only abnormal results are displayed) ?Labs Reviewed  ?URINALYSIS, ROUTINE W REFLEX MICROSCOPIC  ? ? ?EKG ?None ? ?Radiology ?No results found. ? ?Procedures ?Procedures  ? ? ?Medications Ordered in ED ?Medications - No data to display ? ?ED Course/ Medical Decision Making/ A&P ?  ?                        ?Medical Decision Making ?Amount and/or Complexity of Data Reviewed ?Labs: ordered. ?Radiology: ordered. ? ? ?Patient here with hematuria and concern for kidney stone versus cystitis.  Labs urinalysis consistent with hemorrhagic cystitis.  CT scan performed and per my review and interpretation shows no acute abnormalities.  Patient will be treated with Keflex at this time and follow-up with his doctor ? ? ? ? ? ? ? ?Final Clinical Impression(s) / ED Diagnoses ?Final diagnoses:  ?None  ? ? ?Rx / DC Orders ?ED Discharge Orders   ? ? None  ? ?  ? ? ?  ?Lorre Nick, MD ?09/09/21 [phone] ? ?

## 2021-09-09 NOTE — ED Notes (Signed)
Patient transported to CT 

## 2021-12-17 IMAGING — CT CT RENAL STONE PROTOCOL
2 of 4 series · 16 of 46 positions shown, 18 images · non-contrast
Comparison: None.

CLINICAL DATA: Hematuria and low back pain

EXAM:
CT ABDOMEN AND PELVIS WITHOUT CONTRAST
TECHNIQUE: Multidetector CT imaging of the abdomen and pelvis was performed
following the standard protocol without IV contrast.

[Series 2: axial st · axial · 0.76mm/px · z∈[+1106,+1532]mm · 13 of 96 slices shown, 15 images]
[im 6/96  soft-tissue]
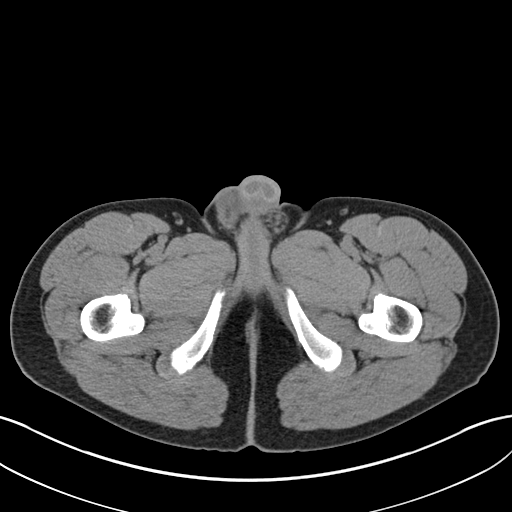
[im 6/96  bone]
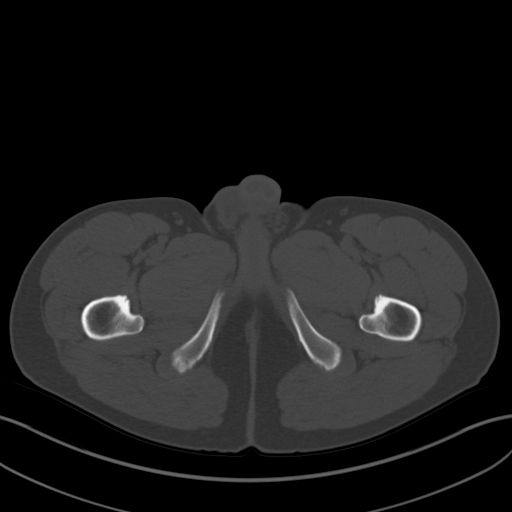
[im 16/96  soft-tissue]
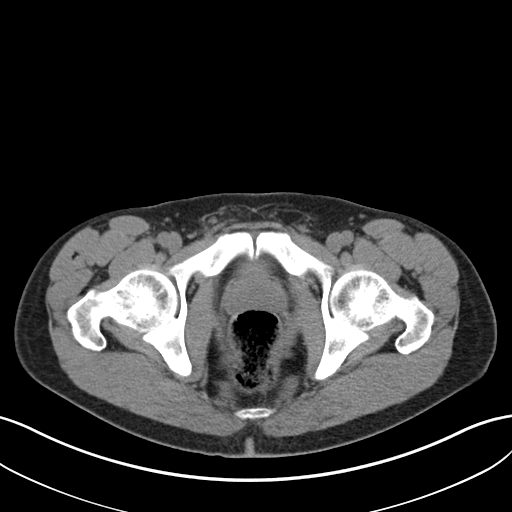
[im 21/96  soft-tissue]
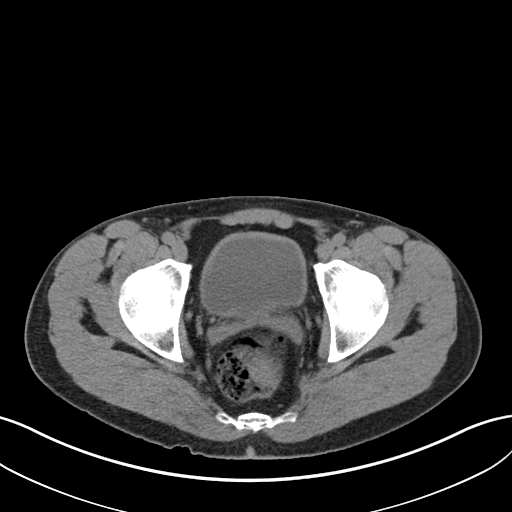
[im 26/96  soft-tissue]
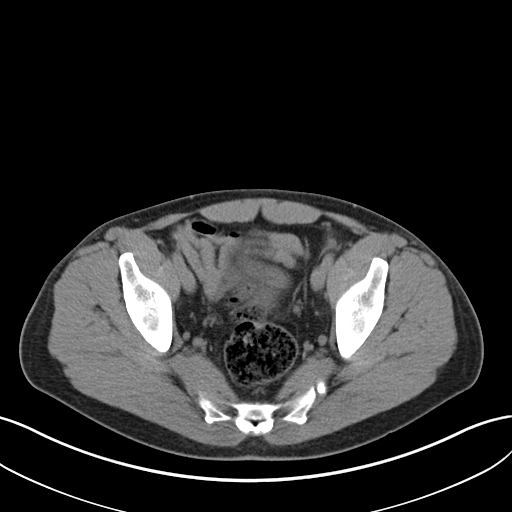
[im 36/96  soft-tissue]
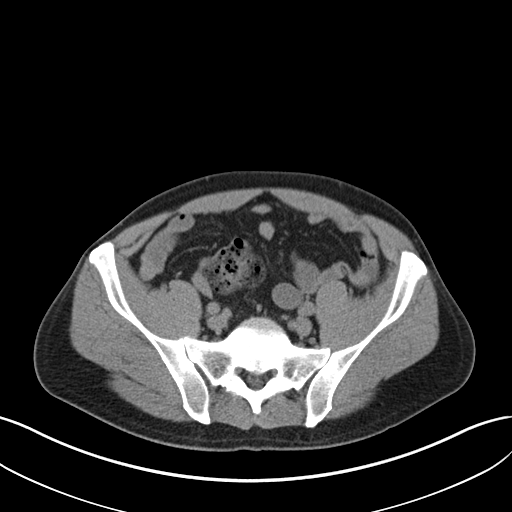
[im 41/96  soft-tissue]
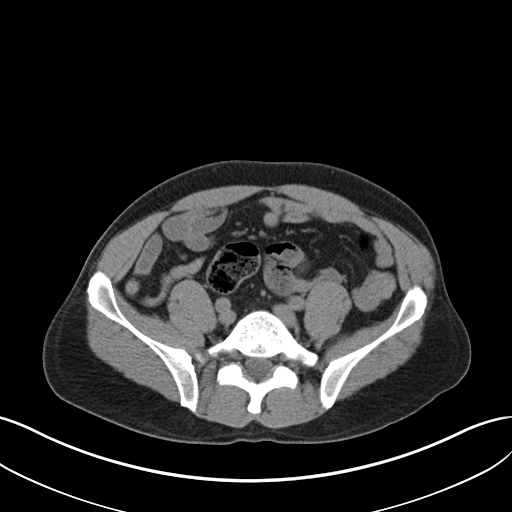
[im 51/96  soft-tissue]
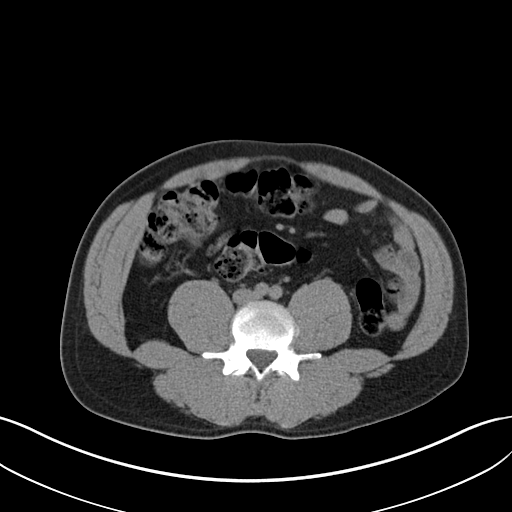
[im 56/96  soft-tissue]
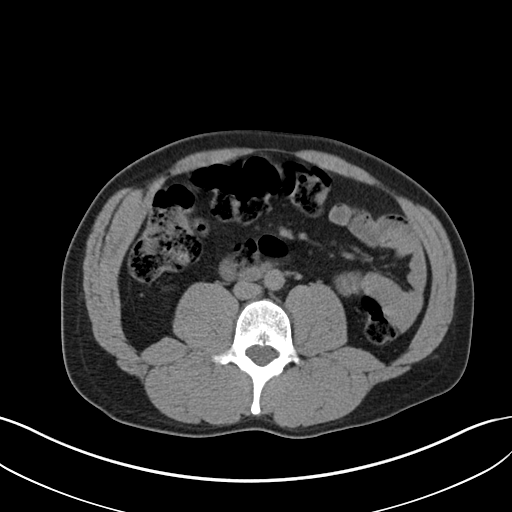
[im 61/96  soft-tissue]
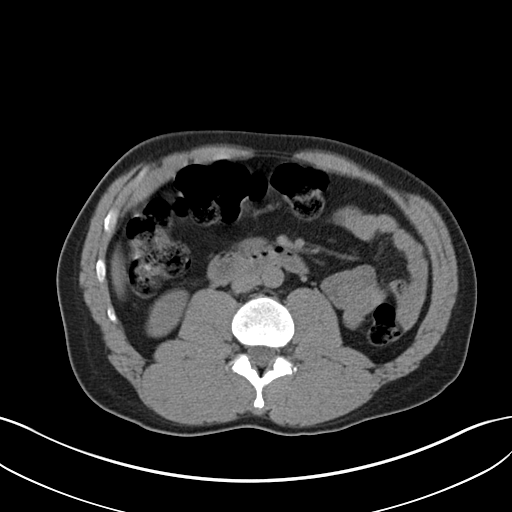
[im 61/96  bone]
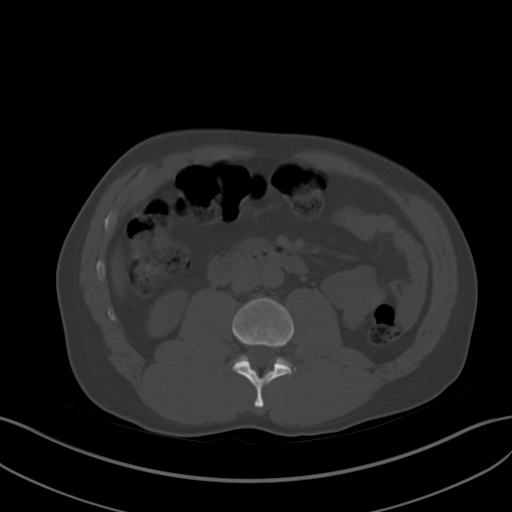
[im 71/96  soft-tissue]
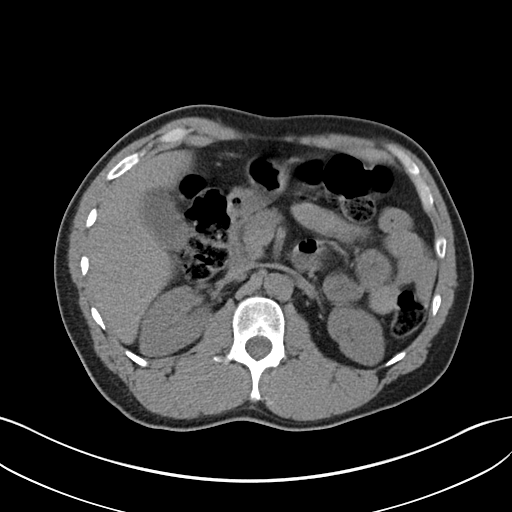
[im 76/96  soft-tissue]
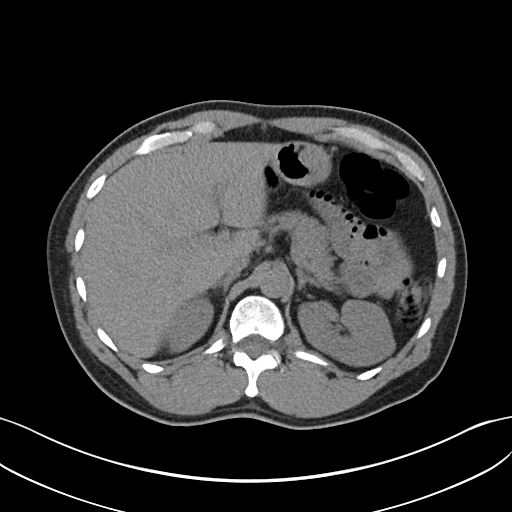
[im 81/96  soft-tissue]
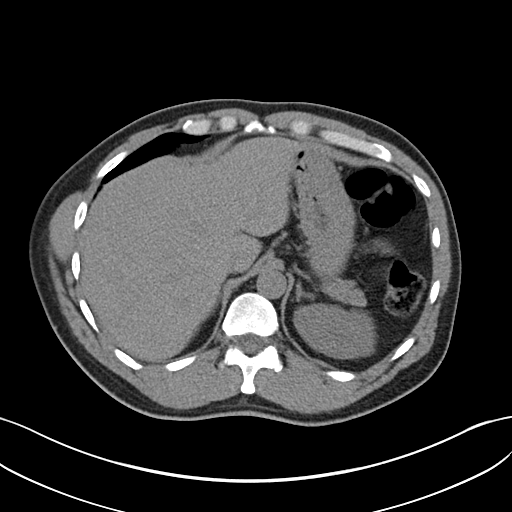
[im 91/96  soft-tissue]
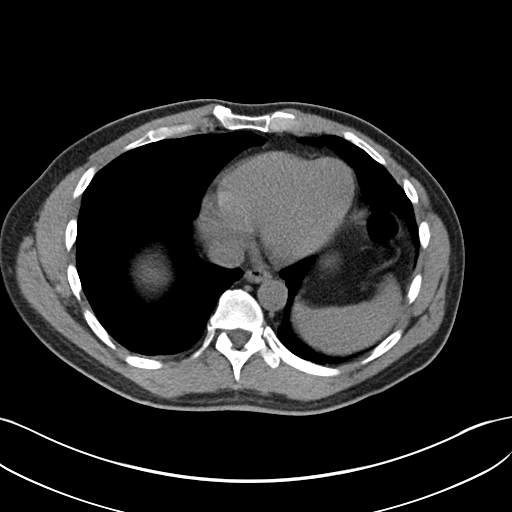

[Series 5: coronal · coronal · 0.76mm/px · 3 of 138 slices shown]
[im 46/138  soft-tissue]
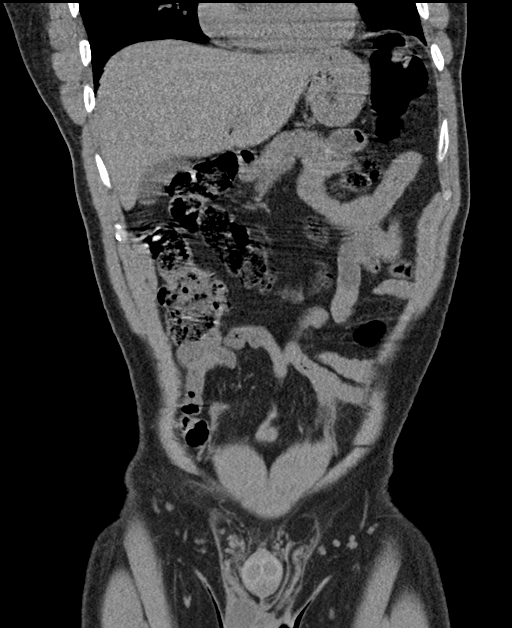
[im 61/138  soft-tissue]
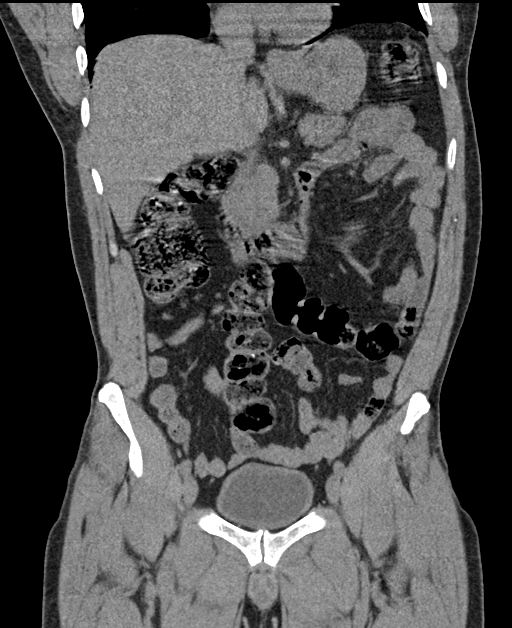
[im 77/138  soft-tissue]
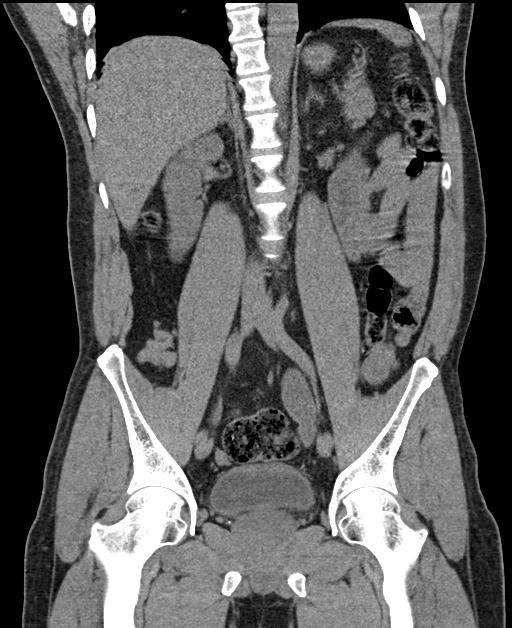

[16 of 46 positions shown; findings below may reference images not displayed]

FINDINGS: Lower chest: No acute abnormality.

Hepatobiliary: No focal liver abnormality is seen. No gallstones,
gallbladder wall thickening, or biliary dilatation.

Pancreas: Unremarkable. No pancreatic ductal dilatation or
surrounding inflammatory changes.

Spleen: Normal in size without focal abnormality.

Adrenals/Urinary Tract: Adrenal glands are unremarkable. Kidneys are
normal, without renal calculi, focal lesion, or hydronephrosis.
Bladder is slightly thick walled, but no surrounding inflammatory
change.

Stomach/Bowel: Stomach is within normal limits. Appendix appears
normal. No evidence of bowel wall thickening, distention, or
inflammatory changes.

Vascular/Lymphatic: No significant vascular findings are present. No
enlarged abdominal or pelvic lymph nodes.

Reproductive: Prostate is unremarkable.

Other: No abdominal wall hernia or abnormality. No abdominopelvic
ascites. Small fat containing umbilical hernia.

Musculoskeletal: No acute or significant osseous findings.
IMPRESSION: Negative for hydronephrosis or ureteral stone. Slightly thick-walled
appearance of the urinary bladder is questionable for cystitis, no
significant surrounding soft tissue stranding or inflammation.

## 2023-03-07 IMAGING — CT CT RENAL STONE PROTOCOL
2 of 4 series · 15 of 46 positions shown, 17 images · non-contrast
Comparison: CT the abdomen and pelvis 06/21/2020.

CLINICAL DATA: 55-year-old male with history of right-sided flank
pain.



[Series 2: stone full · axial · 0.67mm/px · z∈[-470,-60]mm · 12 of 98 slices shown, 14 images]
[im 8/98  soft-tissue]
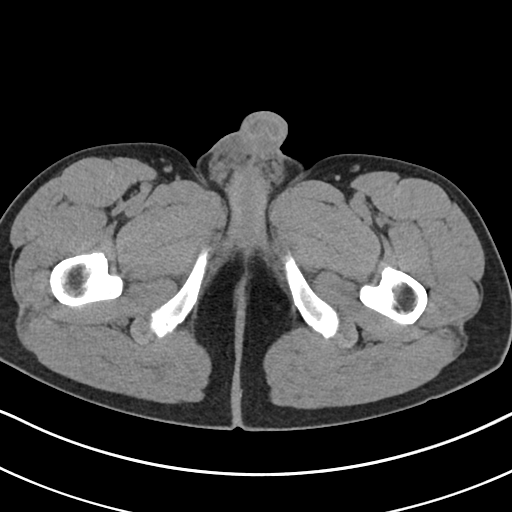
[im 8/98  bone]
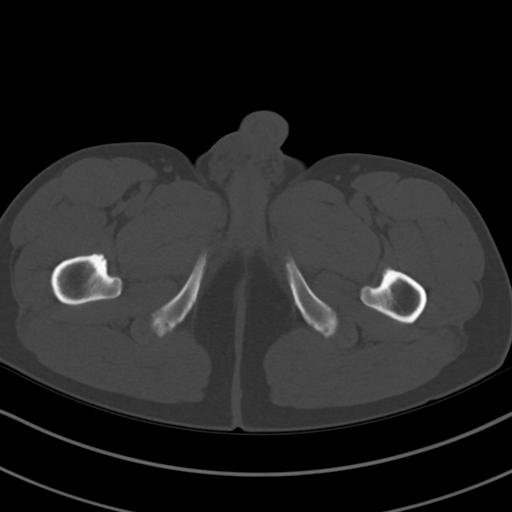
[im 15/98  soft-tissue]
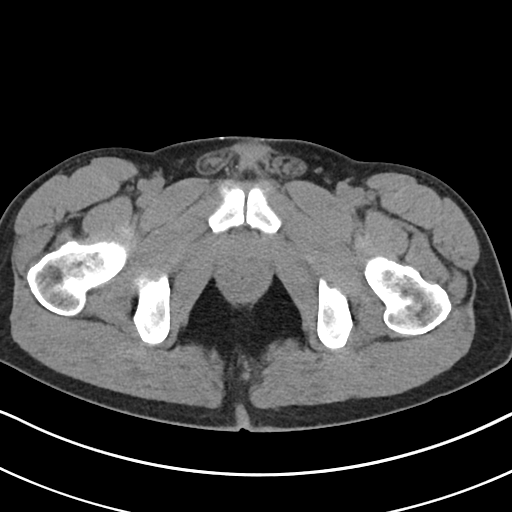
[im 23/98  soft-tissue]
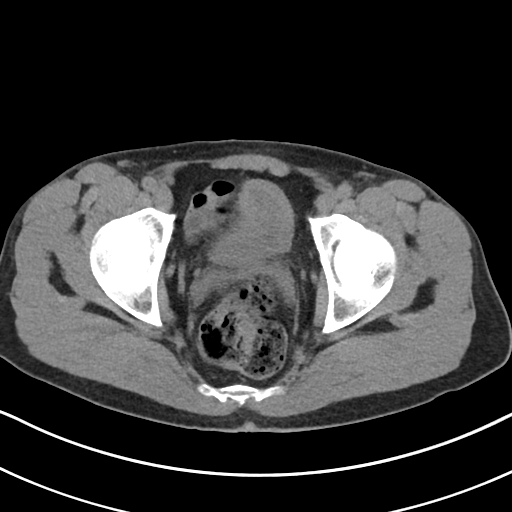
[im 30/98  soft-tissue]
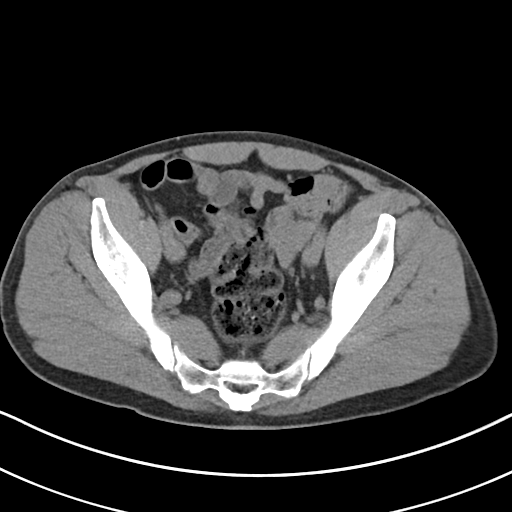
[im 38/98  soft-tissue]
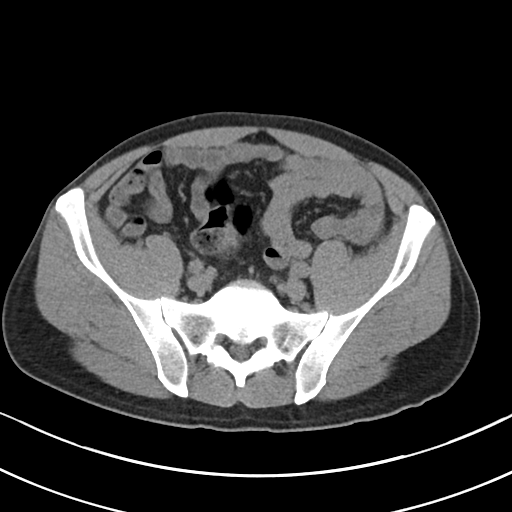
[im 45/98  soft-tissue]
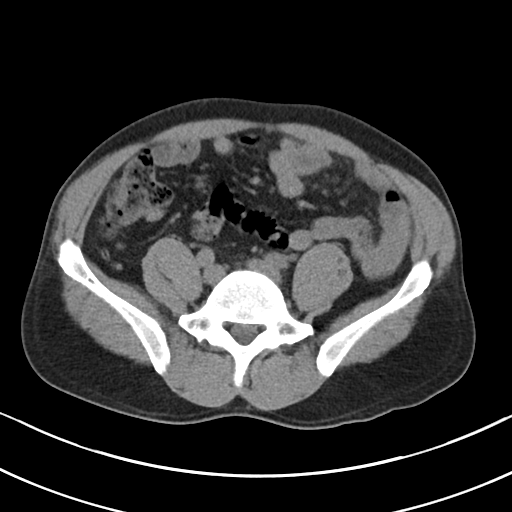
[im 53/98  soft-tissue]
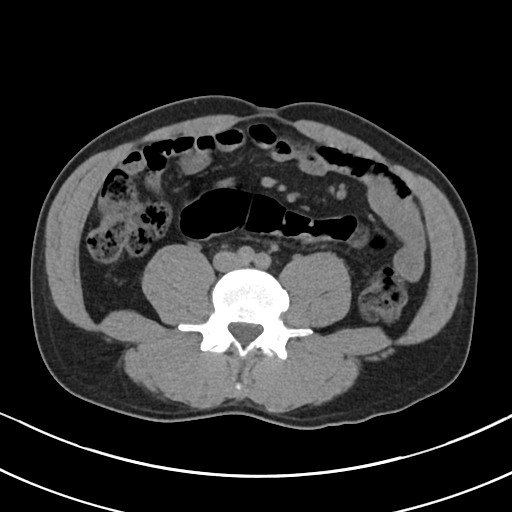
[im 60/98  soft-tissue]
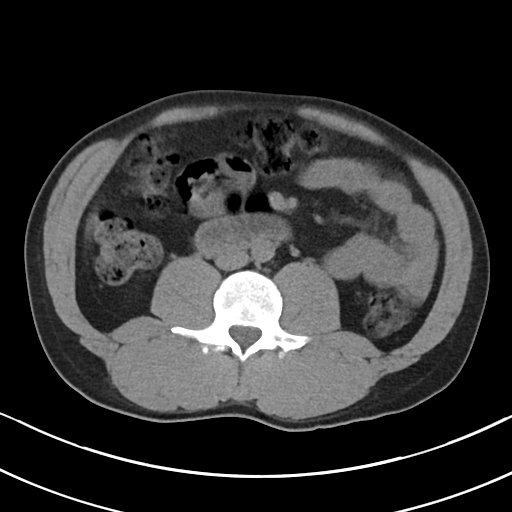
[im 68/98  soft-tissue]
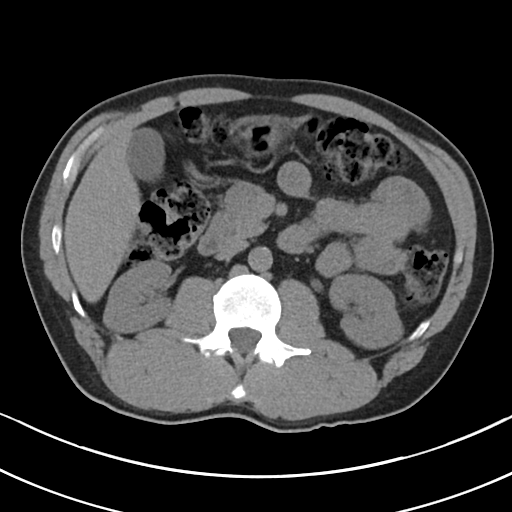
[im 68/98  bone]
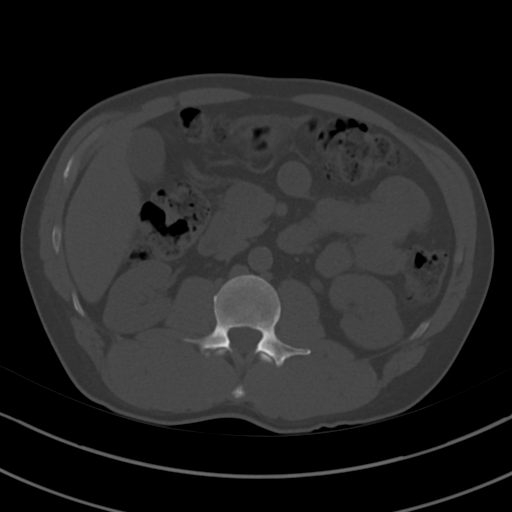
[im 75/98  soft-tissue]
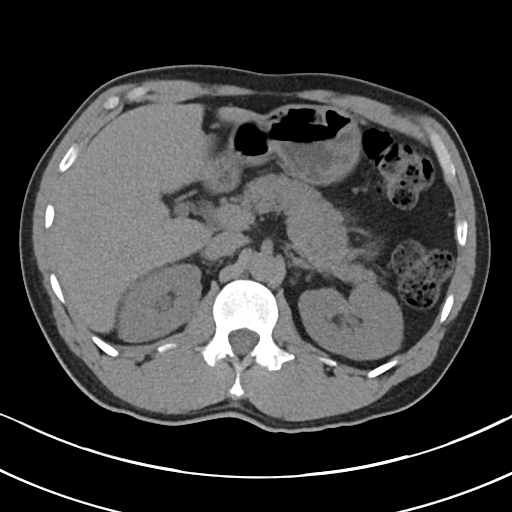
[im 83/98  soft-tissue]
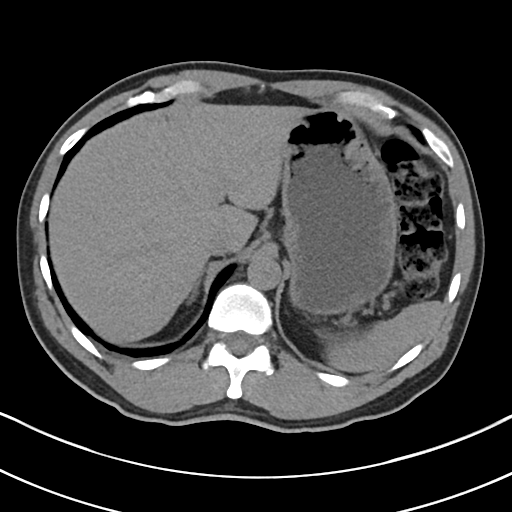
[im 90/98  soft-tissue]
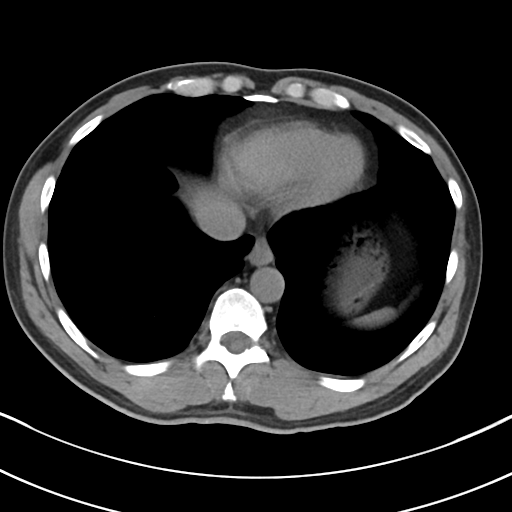

[Series 5: coronal · coronal · 0.72mm/px · 3 of 85 slices shown]
[im 29/85  soft-tissue]
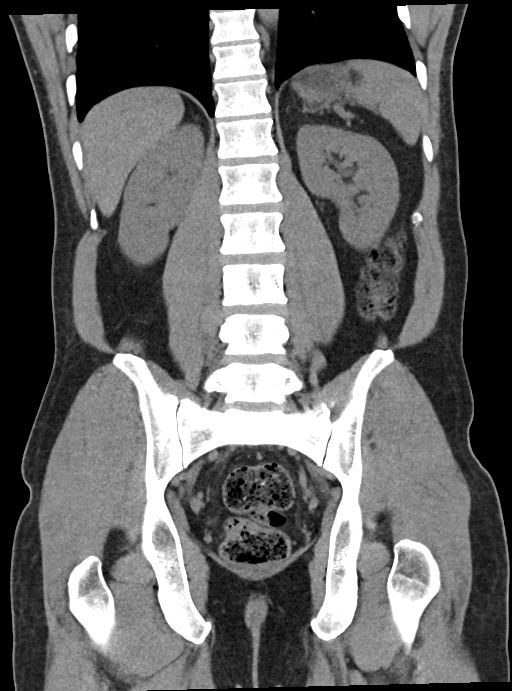
[im 38/85  soft-tissue]
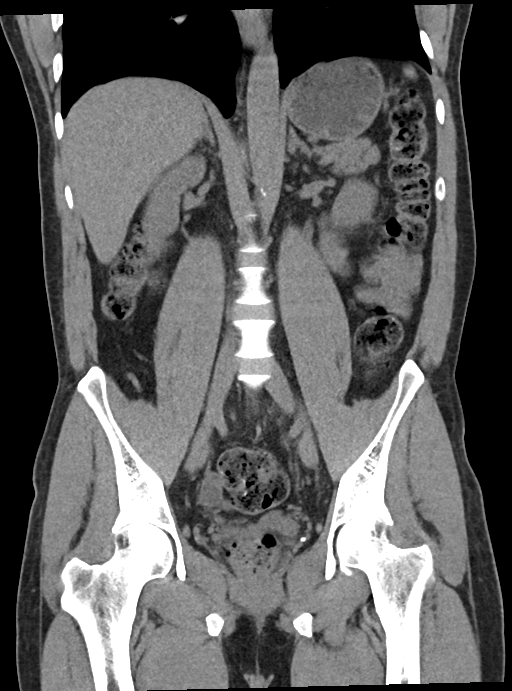
[im 47/85  soft-tissue]
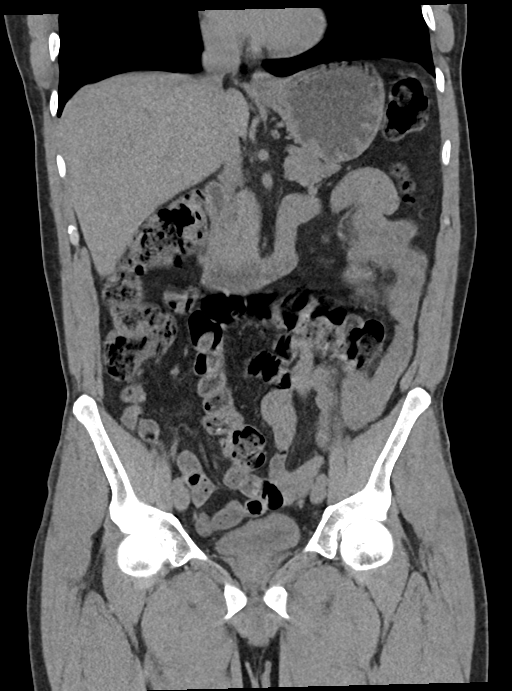

[15 of 46 positions shown; findings below may reference images not displayed]

FINDINGS: Lower chest: Unremarkable.

Hepatobiliary: No definite suspicious cystic or solid hepatic
lesions are confidently identified on today's noncontrast CT
examination. Unenhanced appearance of the gallbladder is normal.

Pancreas: No definite pancreatic mass or peripancreatic fluid
collections or inflammatory changes are noted on today's noncontrast
CT examination.

Spleen: Unremarkable.

Adrenals/Urinary Tract: There are no abnormal calcifications within
the collecting system of either kidney, along the course of either
ureter, or within the lumen of the urinary bladder. No
hydroureteronephrosis or perinephric stranding to suggest urinary
tract obstruction at this time. The unenhanced appearance of the
kidneys is unremarkable bilaterally. Unenhanced appearance of the
urinary bladder is unremarkable. Bilateral adrenal glands are normal
in appearance.

Stomach/Bowel: Unenhanced appearance of the stomach is normal. There
is no pathologic dilatation of small bowel or colon. Normal
appendix.

Vascular/Lymphatic: Aortic atherosclerosis, without evidence of
aneurysm or dissection in the abdominal or pelvic vasculature. No
lymphadenopathy noted in the abdomen or pelvis.

Reproductive: Prostate gland and seminal vesicles are unremarkable
in appearance.

Other: No significant volume of ascites.  No pneumoperitoneum.

Musculoskeletal: There are no aggressive appearing lytic or blastic
lesions noted in the visualized portions of the skeleton.
IMPRESSION: 1. No acute findings are noted in the abdomen or pelvis to account
for the patient's symptoms. Specifically, no urinary tract calculi
no findings of urinary tract obstruction.
2. Aortic atherosclerosis.

## 2024-02-05 ENCOUNTER — Ambulatory Visit: Admitting: Podiatry

## 2024-07-14 ENCOUNTER — Ambulatory Visit: Admitting: Podiatry

## 2024-07-14 ENCOUNTER — Encounter: Payer: Self-pay | Admitting: Podiatry

## 2024-07-14 DIAGNOSIS — B351 Tinea unguium: Secondary | ICD-10-CM | POA: Diagnosis not present

## 2024-07-14 DIAGNOSIS — E1159 Type 2 diabetes mellitus with other circulatory complications: Secondary | ICD-10-CM

## 2024-07-14 DIAGNOSIS — M79674 Pain in right toe(s): Secondary | ICD-10-CM | POA: Diagnosis not present

## 2024-07-14 DIAGNOSIS — M79675 Pain in left toe(s): Secondary | ICD-10-CM | POA: Diagnosis not present

## 2024-07-14 NOTE — Progress Notes (Signed)
 This patient returns to my office for at risk foot care.  This patient requires this care by a professional since this patient will be at risk due to having diabetes.  This patient is unable to cut nails himself since the patient cannot reach his nails.These nails are painful walking and wearing shoes.  This patient presents for at risk foot care today.  General Appearance  Alert, conversant and in no acute stress.  Vascular  Dorsalis pedis and posterior tibial  pulses are weakly palpable  bilaterally.  Capillary return is within normal limits  bilaterally. Cold feet  bilaterally.  Neurologic  Senn-Weinstein monofilament wire test diminished  bilaterally. Muscle power within normal limits bilaterally.  Nails Thick disfigured discolored nails with subungual debris  from hallux to fifth toes bilaterally. No evidence of bacterial infection or drainage bilaterally.  Orthopedic  No limitations of motion  feet .  No crepitus or effusions noted.  No bony pathology or digital deformities noted.  Skin  normotropic skin with no porokeratosis noted bilaterally.  No signs of infections or ulcers noted.     Onychomycosis  Pain in right toes  Pain in left toes  Consent was obtained for treatment procedures.   Mechanical debridement of nails 1-5  bilaterally performed with a nail nipper.  Filed with dremel without incident.    Return office visit  4 months                     Told patient to return for periodic foot care and evaluation due to potential at risk complications.   Cordella Bold DPM

## 2024-11-11 ENCOUNTER — Ambulatory Visit: Admitting: Podiatry
